# Patient Record
Sex: Female | Born: 1960
Health system: Southern US, Community
[De-identification: ages and names within clinical notes are randomized; demographics above are authoritative.]

## PROBLEM LIST (undated history)

## (undated) DIAGNOSIS — T8859XA Other complications of anesthesia, initial encounter: Secondary | ICD-10-CM

## (undated) DIAGNOSIS — E119 Type 2 diabetes mellitus without complications: Secondary | ICD-10-CM

## (undated) DIAGNOSIS — R011 Cardiac murmur, unspecified: Secondary | ICD-10-CM

## (undated) DIAGNOSIS — F32A Depression, unspecified: Secondary | ICD-10-CM

## (undated) DIAGNOSIS — T4145XA Adverse effect of unspecified anesthetic, initial encounter: Secondary | ICD-10-CM

## (undated) DIAGNOSIS — F329 Major depressive disorder, single episode, unspecified: Secondary | ICD-10-CM

## (undated) DIAGNOSIS — M316 Other giant cell arteritis: Secondary | ICD-10-CM

## (undated) DIAGNOSIS — I10 Essential (primary) hypertension: Secondary | ICD-10-CM

## (undated) DIAGNOSIS — K219 Gastro-esophageal reflux disease without esophagitis: Secondary | ICD-10-CM

## (undated) DIAGNOSIS — E785 Hyperlipidemia, unspecified: Secondary | ICD-10-CM

## (undated) DIAGNOSIS — E669 Obesity, unspecified: Secondary | ICD-10-CM

## (undated) DIAGNOSIS — I4891 Unspecified atrial fibrillation: Secondary | ICD-10-CM

## (undated) DIAGNOSIS — G4730 Sleep apnea, unspecified: Secondary | ICD-10-CM

## (undated) HISTORY — PX: REDUCTION MAMMAPLASTY: SUR839

## (undated) HISTORY — DX: Obesity, unspecified: E66.9

## (undated) HISTORY — DX: Hyperlipidemia, unspecified: E78.5

## (undated) HISTORY — PX: TUBAL LIGATION: SHX77

## (undated) HISTORY — DX: Major depressive disorder, single episode, unspecified: F32.9

## (undated) HISTORY — DX: Essential (primary) hypertension: I10

## (undated) HISTORY — DX: Type 2 diabetes mellitus without complications: E11.9

## (undated) HISTORY — DX: Depression, unspecified: F32.A

## (undated) HISTORY — DX: Gastro-esophageal reflux disease without esophagitis: K21.9

## (undated) HISTORY — DX: Cardiac murmur, unspecified: R01.1

## (undated) HISTORY — PX: BREAST SURGERY: SHX581

## (undated) HISTORY — PX: COLONOSCOPY: SHX174

---

## 1999-08-31 ENCOUNTER — Encounter: Payer: Self-pay | Admitting: Gynecology

## 1999-08-31 ENCOUNTER — Ambulatory Visit (HOSPITAL_COMMUNITY): Admission: RE | Admit: 1999-08-31 | Discharge: 1999-08-31 | Payer: Self-pay | Admitting: Gynecology

## 1999-11-02 ENCOUNTER — Ambulatory Visit (HOSPITAL_COMMUNITY): Admission: RE | Admit: 1999-11-02 | Discharge: 1999-11-02 | Payer: Self-pay | Admitting: Gynecology

## 1999-11-02 ENCOUNTER — Encounter: Payer: Self-pay | Admitting: Gynecology

## 2000-08-21 ENCOUNTER — Other Ambulatory Visit: Admission: RE | Admit: 2000-08-21 | Discharge: 2000-08-21 | Payer: Self-pay | Admitting: Gynecology

## 2001-01-15 ENCOUNTER — Encounter: Admission: RE | Admit: 2001-01-15 | Discharge: 2001-04-15 | Payer: Self-pay | Admitting: Family Medicine

## 2002-05-20 ENCOUNTER — Encounter: Payer: Self-pay | Admitting: Family Medicine

## 2002-05-20 ENCOUNTER — Ambulatory Visit (HOSPITAL_COMMUNITY): Admission: RE | Admit: 2002-05-20 | Discharge: 2002-05-20 | Payer: Self-pay | Admitting: Family Medicine

## 2002-06-26 ENCOUNTER — Encounter: Admission: RE | Admit: 2002-06-26 | Discharge: 2002-06-26 | Payer: Self-pay | Admitting: Family Medicine

## 2002-06-26 ENCOUNTER — Encounter: Payer: Self-pay | Admitting: Family Medicine

## 2003-04-16 ENCOUNTER — Ambulatory Visit (HOSPITAL_COMMUNITY): Admission: RE | Admit: 2003-04-16 | Discharge: 2003-04-16 | Payer: Self-pay | Admitting: Obstetrics and Gynecology

## 2004-11-21 ENCOUNTER — Encounter: Admission: RE | Admit: 2004-11-21 | Discharge: 2005-02-19 | Payer: Self-pay | Admitting: Family Medicine

## 2005-09-28 ENCOUNTER — Observation Stay (HOSPITAL_COMMUNITY): Admission: EM | Admit: 2005-09-28 | Discharge: 2005-09-29 | Payer: Self-pay | Admitting: Nurse Practitioner

## 2010-08-13 ENCOUNTER — Encounter: Payer: Self-pay | Admitting: Obstetrics and Gynecology

## 2010-10-20 ENCOUNTER — Other Ambulatory Visit: Payer: Self-pay | Admitting: Family Medicine

## 2010-10-20 ENCOUNTER — Other Ambulatory Visit (HOSPITAL_COMMUNITY)
Admission: RE | Admit: 2010-10-20 | Discharge: 2010-10-20 | Disposition: A | Discharge status: Home / Self Care | Payer: Self-pay | Source: Ambulatory Visit | Attending: Family Medicine | Admitting: Family Medicine

## 2010-10-20 DIAGNOSIS — Z124 Encounter for screening for malignant neoplasm of cervix: Secondary | ICD-10-CM | POA: Insufficient documentation

## 2014-02-10 ENCOUNTER — Encounter: Payer: Self-pay | Admitting: *Deleted

## 2014-02-10 ENCOUNTER — Encounter: Payer: 59 | Attending: Family Medicine | Admitting: *Deleted

## 2014-02-10 VITALS — Ht 62.0 in | Wt 284.7 lb

## 2014-02-10 DIAGNOSIS — Z713 Dietary counseling and surveillance: Secondary | ICD-10-CM | POA: Insufficient documentation

## 2014-02-10 DIAGNOSIS — E119 Type 2 diabetes mellitus without complications: Secondary | ICD-10-CM | POA: Insufficient documentation

## 2014-02-10 DIAGNOSIS — Z794 Long term (current) use of insulin: Secondary | ICD-10-CM | POA: Insufficient documentation

## 2014-02-10 NOTE — Progress Notes (Signed)
Appt start time: 1530 end time:  1700.  Assessment:  Patient was seen on  02/10/14 for individual diabetes education. She lives with her husband, she shops and does the cooking. History of DM 2 for about 14 years. Caring for mother an hour away. Works as Scientist, water quality at [employer], hours vary each week, high stress between caring for mother and poor understanding of her diabetes in the work place. Insurance not covering her Accu-Chek meter, doesn't like the One Touch so hasn't been using it lately. She enjoys crocheting and knitting. She belongs to a couple of knitting and crocheting groups on Monday and Wednesday nights. She states she has low BG usually at work and she passed out one time, called EMT.  Patient Education Plan per assessed needs and concerns is to attend individual session for Diabetes Self Management Education.  Current HbA1c: 8 % stated by patient  Preferred Learning Style:   No preference indicated   Learning Readiness:   Ready  Change in progress  MEDICATIONS: see list, diabetes medications are Amaryl and Lantus  DIETARY INTAKE:  24-hr recall:  B ( AM): skips if working, if off work she may have cerea with soy milk OR eggs, bacon, toast with light mayo or butter and sugar free jelly, diet soda  Snk ( AM): occasionally yogurt [place] OR pineapple OR crackers with PNB  L ( PM): skips often, home made pizza OR grilled chicken or burger on bun with lettuce, onions and cheese Snk ( PM): same as AM D ( PM): meat, potato, yogurt, occasionally vegetable,  Snk ( PM): 2-3 scoops ice cream Beverages: diet soda or water  Usual physical activity: not right now  Estimated energy needs: 1500 calories 170 g carbohydrates 112 g protein 42 g fat  Progress Towards Goal(s):  In progress.   Nutritional Diagnosis:  NB-1.1 Food and nutrition-related knowledge deficit As related to diabetes.  As evidenced by stated A1c of 8%.    Intervention:  Nutrition counseling  provided.  Discussed diabetes disease process and treatment options.  Discussed physiology of diabetes and role of obesity on insulin resistance.  Encouraged moderate weight reduction to improve glucose levels.  Patient medications.  Discussed role of medication on blood glucose and possible side effects  Blood glucose monitoring and interpretation.  Discussed recommended target ranges and individual ranges.    Short-term complications: hyper- and hypo-glycemia.  Discussed causes,symptoms, and treatment options.  Started to provide education on macronutrients on glucose levels.  Provided education on carb counting, importance of regularly scheduled meals/snacks, and meal planning. Plan to continue at next visit  Plan to discuss at next visit:  Effects of physical activity on glucose levels and long-term glucose control.  Recommended 150 minutes of physical activity/week.    Prevention, detection, and treatment of long-term complications.  Discussed the role of prolonged elevated glucose levels on body systems.  Role of stress on blood glucose levels and discussed strategies to manage psychosocial issues.  Recommendations for long-term diabetes self-care.  Provided checklist for medical, dental, and emotional self-care.  Plan:  Aim for 3 Carb Choices per meal (45 grams) +/- 1 either way  Aim for 0-2 Carbs per snack if hungry  Include protein in moderation with your meals and snacks Consider reading food labels for Total Carbohydrate of foods Consider asking your MD about adding a fast acting insulin to use to correct high BGs Consider checking BG at alternate times per day as directed by MD  Consider taking medication Lantus at  consistent time each day  Teaching Method Utilized: Visual, Auditory, and Hands on  Handouts given during visit include: Living Well with Diabetes Carb Counting and Food Label handouts Meal Plan Card  Barriers to learning/adherence to lifestyle change:  stress of caring for parent with Alzheimers  Diabetes self-care support plan:   [facility] support group  Demonstrated degree of understanding via:  Teach Back   Monitoring/Evaluation:  Dietary intake, exercise, reading food labels, and body weight in 1 month(s).

## 2014-02-10 NOTE — Patient Instructions (Signed)
Plan:  Aim for 3 Carb Choices per meal (45 grams) +/- 1 either way  Aim for 0-2 Carbs per snack if hungry  Include protein in moderation with your meals and snacks Consider reading food labels for Total Carbohydrate of foods Consider asking your MD about adding a fast acting insulin to use to correct high BGs Consider checking BG at alternate times per day as directed by MD  Consider taking medication Lantus at consistent time each day

## 2014-02-13 ENCOUNTER — Encounter: Payer: Self-pay | Admitting: *Deleted

## 2014-02-23 ENCOUNTER — Encounter: Payer: 59 | Attending: Family Medicine | Admitting: *Deleted

## 2014-02-23 ENCOUNTER — Encounter: Payer: Self-pay | Admitting: *Deleted

## 2014-02-23 VITALS — Ht 62.0 in | Wt 288.1 lb

## 2014-02-23 DIAGNOSIS — Z713 Dietary counseling and surveillance: Secondary | ICD-10-CM | POA: Diagnosis not present

## 2014-02-23 DIAGNOSIS — E119 Type 2 diabetes mellitus without complications: Secondary | ICD-10-CM | POA: Diagnosis present

## 2014-02-23 DIAGNOSIS — Z794 Long term (current) use of insulin: Secondary | ICD-10-CM | POA: Insufficient documentation

## 2014-02-23 NOTE — Progress Notes (Signed)
Appt start time: 1400 end time:  1500.  Assessment:  Patient was seen on  02/23/14 for individual diabetes education follow up visit. She is surprised at weight gain of 3 pounds in last 2 weeks, but feels she has been eating less food. She states she is taking her Lantus at more consistent time each day. She continues to struggle with her work schedule and not having set break times to eat or even drink anything. She is trying Systems developer at a breakfast choice at work.   Patient Education Plan per assessed needs and concerns is to attend individual session for Diabetes Self Management Education.  Current HbA1c: 8 % stated by patient  Preferred Learning Style:   No preference indicated   Learning Readiness:   Ready  Change in progress  MEDICATIONS: see list, diabetes medications are Amaryl and Lantus  DIETARY INTAKE:  24-hr recall:  B ( AM): skips if working, if off work she may have cerea with soy milk OR eggs, bacon, toast with light mayo or butter and sugar free jelly, diet soda  Snk ( AM): occasionally yogurt [place] OR pineapple OR crackers with PNB  L ( PM): skips often, home made pizza OR grilled chicken or burger on bun with lettuce, onions and cheese Snk ( PM): same as AM D ( PM): meat, potato, yogurt, occasionally vegetable,  Snk ( PM): 2-3 scoops ice cream Beverages: diet soda or water  Usual physical activity: not right now  Estimated energy needs: 1500 calories 170 g carbohydrates 112 g protein 42 g fat  Progress Towards Goal(s):  In progress.   Nutritional Diagnosis:  NB-1.1 Food and nutrition-related knowledge deficit As related to diabetes.  As evidenced by stated A1c of 8%.    Intervention:  Nutrition counseling continued..  Continued to provide education on macronutrients on glucose levels.  Provided education on carb counting, importance of regularly scheduled meals/snacks, and meal planning. Reviewed reading food labels and informed her of  APP for her phone; Calorie [employer] so she can look up Carb content of foods she enjoys.  Answered questions about insulin injection sites and demonstrated locations for her body.  Plan to discuss at next visit:  Effects of physical activity on glucose levels and long-term glucose control.  Recommended 150 minutes of physical activity/week.  Prevention, detection, and treatment of long-term complications.  Discussed the role of prolonged elevated glucose levels on body systems.  Role of stress on blood glucose levels and discussed strategies to manage psychosocial issues.  Recommendations for long-term diabetes self-care.  Provided checklist for medical, dental, and emotional self-care.  Plan:  Aim for 4 Carb Choices per meal (60 grams) +/- 1 either way  Aim for 0-2 Carbs per snack if hungry  Include protein in moderation with your meals and snacks Consider options for carb choices at work when you don't have time to eat a meal, to prevent low BG Consider reading food labels for Total Carbohydrate of foods Consider asking your MD about adding a fast acting insulin to use to correct high BGs Consider checking BG at alternate times per day as directed by MD  Continue taking medication Lantus at consistent time each day   Teaching Method Utilized: Visual, Auditory, and Hands on  Handouts given during visit include: Insulin injection Location Handout Carb Counting and Food Label handouts  Barriers to learning/adherence to lifestyle change: stress of caring for parent with Alzheimer's and stressful work place  Diabetes self-care support plan:   [facility]  support group  Demonstrated degree of understanding via:  Teach Back   Monitoring/Evaluation:  Dietary intake, exercise, reading food labels, and body weight in 1 month(s).

## 2014-02-23 NOTE — Patient Instructions (Signed)
Plan:  Aim for 4 Carb Choices per meal (60 grams) +/- 1 either way  Aim for 0-2 Carbs per snack if hungry  Include protein in moderation with your meals and snacks Consider options for carb choices at work when you don't have time to eat a meal, to prevent low BG Consider reading food labels for Total Carbohydrate of foods Consider asking your MD about adding a fast acting insulin to use to correct high BGs Consider checking BG at alternate times per day as directed by MD  Continue taking medication Lantus at consistent time each day

## 2014-03-19 ENCOUNTER — Ambulatory Visit: Payer: 59 | Admitting: *Deleted

## 2014-04-02 ENCOUNTER — Ambulatory Visit: Payer: 59 | Admitting: *Deleted

## 2014-05-19 ENCOUNTER — Encounter: Payer: 59 | Attending: Family Medicine | Admitting: *Deleted

## 2014-05-19 VITALS — Ht 62.0 in | Wt 283.6 lb

## 2014-05-19 DIAGNOSIS — E119 Type 2 diabetes mellitus without complications: Secondary | ICD-10-CM | POA: Insufficient documentation

## 2014-05-19 DIAGNOSIS — Z794 Long term (current) use of insulin: Secondary | ICD-10-CM | POA: Diagnosis not present

## 2014-05-19 DIAGNOSIS — Z713 Dietary counseling and surveillance: Secondary | ICD-10-CM | POA: Diagnosis not present

## 2014-05-19 NOTE — Patient Instructions (Signed)
Plan:  Aim for 4 Carb Choices per meal (60 grams) +/- 1 either way  Aim for 0-2 Carbs per snack if hungry  Include protein in moderation with your meals and snacks Consider options for carb choices at work when you don't have time to eat a meal, to prevent low BG Consider reading food labels for Total Carbohydrate of foods Consider asking your MD about adding a fast acting insulin to use to correct high BGs Consider checking BG at alternate times per day as directed by MD  Continue taking medication Lantus at consistent time each day

## 2014-05-19 NOTE — Progress Notes (Signed)
Appt start time: 1400 end time:  1430.  Assessment:  Patient was seen on  05/19/14 for individual diabetes education follow up visit. She is happy with weight loss of 5 pounds since last visit, 2 months ago. She is worried about her mother who is no longer eating and has Alzhiemer's. She states she is taking her Lantus at more consistent time each day. She continues to struggle with her work schedule at [employer] and not having set break times to eat or even drink anything. She is working on [employer] as able with the many stresses in her life right now.  Patient Education Plan per assessed needs and concerns is to attend individual session for Diabetes Self Management Education.  Current HbA1c: 8 % stated by patient  Preferred Learning Style:   No preference indicated   Learning Readiness:   Ready  Change in progress  MEDICATIONS: see list, diabetes medications are Amaryl, Lantus and Januvia  DIETARY INTAKE:  24-hr recall:  B ( AM): skips if working, if off work she may have cerea with soy milk OR eggs, bacon, toast with light mayo or butter and sugar free jelly, diet soda  Snk ( AM): occasionally yogurt [place] OR pineapple OR crackers with PNB  L ( PM): skips often, home made pizza OR grilled chicken or burger on bun with lettuce, onions and cheese Snk ( PM): same as AM D ( PM): meat, potato, yogurt, occasionally vegetable,  Snk ( PM): 2-3 scoops ice cream Beverages: diet soda or water  Usual physical activity: not right now  Estimated energy needs: 1500 calories 170 g carbohydrates 112 g protein 42 g fat  Progress Towards Goal(s):  In progress.   Nutritional Diagnosis:  NB-1.1 Food and nutrition-related knowledge deficit As related to diabetes.  As evidenced by stated A1c of 8%.    Intervention:  Nutrition counseling continued.Marland Kitchen  Spent much of this visit listening to her concerns about losing her mother in the near future and her concerns about her job situation.  Discussed the support she and her family would get if they called Hospice to help with her mother's end of life. From a Diabetes standpoint, I reviewed the advantages of carb counting as method of portion control and the rationale of SMBG at least once a day.  Plan:  Aim for 4 Carb Choices per meal (60 grams) +/- 1 either way  Aim for 0-2 Carbs per snack if hungry  Include protein in moderation with your meals and snacks Consider options for carb choices at work when you don't have time to eat a meal, to prevent low BG Consider reading food labels for Total Carbohydrate of foods Consider asking your MD about adding a fast acting insulin to use to correct high BGs Consider checking BG at alternate times per day as directed by MD  Continue taking medication Lantus at consistent time each day   Teaching Method Utilized: Visual, Auditory, and Hands on  Handouts given during visit include: No new handouts  Barriers to learning/adherence to lifestyle change: stress of caring for parent with Alzheimer's and stressful work place  Diabetes self-care support plan:   [facility] support group  Demonstrated degree of understanding via:  Teach Back   Monitoring/Evaluation:  Dietary intake, exercise, reading food labels, and body weight in 3 month(s).

## 2014-08-25 ENCOUNTER — Ambulatory Visit: Payer: 59 | Admitting: *Deleted

## 2014-08-31 ENCOUNTER — Other Ambulatory Visit: Payer: Self-pay | Admitting: Family Medicine

## 2014-08-31 DIAGNOSIS — Z1231 Encounter for screening mammogram for malignant neoplasm of breast: Secondary | ICD-10-CM

## 2014-09-08 ENCOUNTER — Ambulatory Visit
Admission: RE | Admit: 2014-09-08 | Discharge: 2014-09-08 | Disposition: A | Discharge status: Home / Self Care | Payer: 59 | Source: Ambulatory Visit | Attending: Family Medicine | Admitting: Family Medicine

## 2014-09-08 DIAGNOSIS — Z1231 Encounter for screening mammogram for malignant neoplasm of breast: Secondary | ICD-10-CM

## 2015-12-22 ENCOUNTER — Ambulatory Visit: Payer: Self-pay | Admitting: General Surgery

## 2016-02-01 NOTE — Patient Instructions (Addendum)
Angela [name]  02/01/2016   Your procedure is scheduled on: Thursday 02/10/2016  Report to [facility] Main  Entrance take [place]  elevators to 3rd floor to  [place] at  [place]  AM.  Call this number if you have problems the morning of surgery [phone]   Remember: ONLY 1 PERSON MAY GO WITH YOU TO SHORT STAY TO GET  READY MORNING OF [place].   Do not eat food or drink liquids :After Midnight.   TAKE 1/2 DOSE OF BASAGLAR INSULIN THE NIGHT BEFORE SURGERY!   Take these medicines the morning of surgery with A SIP OF WATER:  Prednisone             DO NOT TAKE ANY DIABETIC MEDICATIONS DAY OF YOUR SURGERY!                               You may not have any metal on your body including hair pins and              piercings  Do not wear jewelry, make-up, lotions, powders or perfumes, deodorant             Do not wear nail polish.  Do not shave  48 hours prior to surgery.              Men may shave face and neck.   Do not bring valuables to the hospital. [place].  Contacts, dentures or bridgework may not be worn into surgery.  Leave suitcase in the car. After surgery it may be brought to your room.     Patients discharged the day of surgery will not be allowed to drive home.  Name and phone number of your driver:  Special Instructions: N/A              Please read over the following fact sheets you were given: _____________________________________________________________________             [facility] - Preparing for Surgery Before surgery, you can play an important role.  Because skin is not sterile, your skin needs to be as free of germs as possible.  You can reduce the number of germs on your skin by washing with CHG (chlorahexidine gluconate) soap before surgery.  CHG is an antiseptic cleaner which kills germs and bonds with the skin to continue killing germs even after washing. Please DO NOT  use if you have an allergy to CHG or antibacterial soaps.  If your skin becomes reddened/irritated stop using the CHG and inform your nurse when you arrive at Short Stay. Do not shave (including legs and underarms) for at least 48 hours prior to the first CHG shower.  You may shave your face/neck. Please follow these instructions carefully:  1.  Shower with CHG Soap the night before surgery and the  morning of Surgery.  2.  If you choose to wash your hair, wash your hair first as usual with your  normal  shampoo.  3.  After you shampoo, rinse your hair and body thoroughly to remove the  shampoo.  4.  Use CHG as you would any other liquid soap.  You can apply chg directly  to the skin and wash                       Gently with a scrungie or clean washcloth.  5.  Apply the CHG Soap to your body ONLY FROM THE NECK DOWN.   Do not use on face/ open                           Wound or open sores. Avoid contact with eyes, ears mouth and genitals (private parts).                       Wash face,  Genitals (private parts) with your normal soap.             6.  Wash thoroughly, paying special attention to the area where your surgery  will be performed.  7.  Thoroughly rinse your body with warm water from the neck down.  8.  DO NOT shower/wash with your normal soap after using and rinsing off  the CHG Soap.                9.  Pat yourself dry with a clean towel.            10.  Wear clean pajamas.            11.  Place clean sheets on your bed the night of your first shower and do not  sleep with pets. Day of Surgery : Do not apply any lotions/deodorants the morning of surgery.  Please wear clean clothes to the hospital/surgery center.  FAILURE TO FOLLOW THESE INSTRUCTIONS MAY RESULT IN THE CANCELLATION OF YOUR SURGERY PATIENT SIGNATURE_________________________________  NURSE  SIGNATURE__________________________________  ________________________________________________________________________

## 2016-02-02 ENCOUNTER — Encounter (HOSPITAL_COMMUNITY): Payer: Self-pay

## 2016-02-02 ENCOUNTER — Other Ambulatory Visit: Payer: Self-pay

## 2016-02-02 ENCOUNTER — Encounter (HOSPITAL_COMMUNITY)
Admission: RE | Admit: 2016-02-02 | Discharge: 2016-02-02 | Disposition: A | Discharge status: Home / Self Care | Payer: 59 | Source: Ambulatory Visit | Attending: General Surgery | Admitting: General Surgery

## 2016-02-02 DIAGNOSIS — Z01812 Encounter for preprocedural laboratory examination: Secondary | ICD-10-CM | POA: Diagnosis not present

## 2016-02-02 HISTORY — DX: Other complications of anesthesia, initial encounter: T88.59XA

## 2016-02-02 HISTORY — DX: Adverse effect of unspecified anesthetic, initial encounter: T41.45XA

## 2016-02-02 LAB — CBC
HCT: 37.3 % (ref 36.0–46.0)
Hemoglobin: 11.7 g/dL — ABNORMAL LOW (ref 12.0–15.0)
MCH: 27 pg (ref 26.0–34.0)
MCHC: 31.4 g/dL (ref 30.0–36.0)
MCV: 86.1 fL (ref 78.0–100.0)
PLATELETS: 369 10*3/uL (ref 150–400)
RBC: 4.33 MIL/uL (ref 3.87–5.11)
RDW: 13.9 % (ref 11.5–15.5)
WBC: 21.9 10*3/uL — AB (ref 4.0–10.5)

## 2016-02-02 LAB — BASIC METABOLIC PANEL
Anion gap: 8 (ref 5–15)
BUN: 22 mg/dL — AB (ref 6–20)
CO2: 29 mmol/L (ref 22–32)
CREATININE: 0.89 mg/dL (ref 0.44–1.00)
Calcium: 9.6 mg/dL (ref 8.9–10.3)
Chloride: 103 mmol/L (ref 101–111)
GFR calc Af Amer: >60 mL/min (ref >60)
GLUCOSE: 56 mg/dL — AB (ref 65–99)
Potassium: 4 mmol/L (ref 3.5–5.1)
SODIUM: 140 mmol/L (ref 135–145)

## 2016-02-02 NOTE — Progress Notes (Signed)
   02/02/16 1028  OBSTRUCTIVE SLEEP APNEA  Have you ever been diagnosed with sleep apnea through a sleep study? No  Do you snore loudly (loud enough to be heard through closed doors)?  1  Do you often feel tired, fatigued, or sleepy during the daytime (such as falling asleep during driving or talking to someone)? 1  Has anyone observed you stop breathing during your sleep? 0  Do you have, or are you being treated for high blood pressure? 1  BMI more than 35 kg/m2? 1  Age > 50 (1-yes) 1  Neck circumference greater than:Female 16 inches or larger, Female 17inches or larger? 1  Female Gender (Yes=1) 0  Obstructive Sleep Apnea Score 6  Score 5 or greater  Results sent to PCP

## 2016-02-03 LAB — HEMOGLOBIN A1C
HEMOGLOBIN A1C: 12.5 % — AB (ref 4.8–5.6)
MEAN PLASMA GLUCOSE: 312 mg/dL

## 2016-02-03 NOTE — Progress Notes (Signed)
Consulted Dr. Montez Hageman, Anesthesia about confirmed EKG from 02/02/2016 and per Dr. Marcell Barlow, patient needs Cardiac Clearance before  having surgery! Angela [name] at [facility] notified to inform Dr. Kieth Brightly.

## 2016-02-07 ENCOUNTER — Encounter: Payer: Self-pay | Admitting: Internal Medicine

## 2016-02-07 ENCOUNTER — Ambulatory Visit (INDEPENDENT_AMBULATORY_CARE_PROVIDER_SITE_OTHER): Payer: 59 | Admitting: Internal Medicine

## 2016-02-07 ENCOUNTER — Telehealth: Payer: Self-pay | Admitting: *Deleted

## 2016-02-07 ENCOUNTER — Encounter (INDEPENDENT_AMBULATORY_CARE_PROVIDER_SITE_OTHER): Payer: Self-pay

## 2016-02-07 VITALS — BP 144/82 | HR 101 | Ht 62.5 in | Wt 282.4 lb

## 2016-02-07 DIAGNOSIS — R9431 Abnormal electrocardiogram [ECG] [EKG]: Secondary | ICD-10-CM

## 2016-02-07 DIAGNOSIS — Z01818 Encounter for other preprocedural examination: Secondary | ICD-10-CM | POA: Diagnosis not present

## 2016-02-07 NOTE — Telephone Encounter (Signed)
Spoke with family member (ok per DPR) and informed him to tell patient. Son will relay the message.

## 2016-02-07 NOTE — Patient Instructions (Addendum)
Medication Instructions:  Your physician recommends that you continue on your current medications as directed. Please refer to the Current Medication list given to you today.  --- If you need a refill on your cardiac medications before your next appointment, please call your pharmacy. ---  Labwork: None ordered  Testing/Procedures: Your physician has requested that you have a lexiscan myoview. For further information please visit [email or web address]. Please follow instruction sheet, as given.  Follow-Up: To be determined based upon your stress test results.  We will call you with the results.  Thank you for choosing [facility]!!     Any Other Special Instructions Will Be Listed Below (If Applicable).  Pharmacologic Stress Electrocardiogram A pharmacologic stress electrocardiogram is a heart (cardiac) test that uses nuclear imaging to evaluate the blood supply to your heart. This test may also be called a pharmacologic stress electrocardiography. Pharmacologic means that a medicine is used to increase your heart rate and blood pressure.  This stress test is done to find areas of poor blood flow to the heart by determining the extent of coronary artery disease (CAD). Some people exercise on a treadmill, which naturally increases the blood flow to the heart. For those people unable to exercise on a treadmill, a medicine is used. This medicine stimulates your heart and will cause your heart to beat harder and more quickly, as if you were exercising.  Pharmacologic stress tests can help determine:  The adequacy of blood flow to your heart during increased levels of activity in order to clear you for discharge home.  The extent of coronary artery blockage caused by CAD.  Your prognosis if you have suffered a heart attack.  The effectiveness of cardiac procedures done, such as an angioplasty, which can increase the circulation in your coronary arteries.  Causes of chest pain or  pressure. LET [facility] CARE PROVIDER KNOW ABOUT:  Any allergies you have.  All medicines you are taking, including vitamins, herbs, eye drops, creams, and over-the-counter medicines.  Previous problems you or members of your family have had with the use of anesthetics.  Any blood disorders you have.  Previous surgeries you have had.  Medical conditions you have.  Possibility of pregnancy, if this applies.  If you are currently breastfeeding. RISKS AND COMPLICATIONS Generally, this is a safe procedure. However, as with any procedure, complications can occur. Possible complications include:  You develop pain or pressure in the following areas:  Chest.  Jaw or neck.  Between your shoulder blades.  Radiating down your left arm.  Headache.  Dizziness or light-headedness.  Shortness of breath.  Increased or irregular heartbeat.  Low blood pressure.  Nausea or vomiting.  Flushing.  Redness going up the arm and slight pain during injection of medicine.  Heart attack (rare). BEFORE THE PROCEDURE   Avoid all forms of caffeine for 24 hours before your test or as directed by your health care provider. This includes coffee, tea (even decaffeinated tea), caffeinated sodas, chocolate, cocoa, and certain pain medicines.  Follow your health care provider's instructions regarding eating and drinking before the test.  Take your medicines as directed at regular times with water unless instructed otherwise. Exceptions may include:  If you have diabetes, ask how you are to take your insulin or pills. It is common to adjust insulin dosing the morning of the test.  If you are taking beta-blocker medicines, it is important to talk to your health care provider about these medicines well before the date of your  test. Taking beta-blocker medicines may interfere with the test. In some cases, these medicines need to be changed or stopped 24 hours or more before the test.  If you wear  a nitroglycerin patch, it may need to be removed prior to the test. Ask your health care provider if the patch should be removed before the test.  If you use an inhaler for any breathing condition, bring it with you to the test.  If you are an outpatient, bring a snack so you can eat right after the stress phase of the test.  Do not smoke for 4 hours prior to the test or as directed by your health care provider.  Do not apply lotions, powders, creams, or oils on your chest prior to the test.  Wear comfortable shoes and clothing. Let your health care provider know if you were unable to complete or follow the preparations for your test. PROCEDURE   Multiple patches (electrodes) will be put on your chest. If needed, small areas of your chest may be shaved to get better contact with the electrodes. Once the electrodes are attached to your body, multiple wires will be attached to the electrodes, and your heart rate will be monitored.  An IV access will be started. A nuclear trace (isotope) is given. The isotope may be given intravenously, or it may be swallowed. Nuclear refers to several types of radioactive isotopes, and the nuclear isotope lights up the arteries so that the nuclear images are clear. The isotope is absorbed by your body. This results in low radiation exposure.  A resting nuclear image is taken to show how your heart functions at rest.  A medicine is given through the IV access.  A second scan is done about 1 hour after the medicine injection and determines how your heart functions under stress.  During this stress phase, you will be connected to an electrocardiogram machine. Your blood pressure and oxygen levels will be monitored. AFTER THE PROCEDURE   Your heart rate and blood pressure will be monitored after the test.  You may return to your normal schedule, including diet,activities, and medicines, unless your health care provider tells you otherwise.   This  information is not intended to replace advice given to you by your health care provider. Make sure you discuss any questions you have with your health care provider.   Document Released: 11/26/2008 Document Revised: 07/15/2013 Document Reviewed: 03/17/2013 Elsevier Interactive Patient Education [employer].

## 2016-02-07 NOTE — Telephone Encounter (Signed)
Follow Up:   Angela [name] from Dr Kieth Brightly called and said Dr Kieth Brightly will not be the one making the decision whether the surgery will be cancelled.The decision to cancel surgery will come from [facility] Pre Op/Anthesis.

## 2016-02-07 NOTE — Progress Notes (Signed)
HPI [name] is referred today for preoperative evaluation. She is a 55 yo woman with a h/o HTN, DM, and eye pain. She was found on a routine ECG to have abnormal T wave inversions and is referred for additional evaluation. The patient does not have angina. She denies sob but is sedentary. She has been diabetic for 16 years. She is morbidly obese. No syncope.  Allergies  Allergen Reactions  . E-Mycin [Erythromycin] Nausea And Vomiting  . Avocado Diarrhea and Nausea And Vomiting  . Banana Diarrhea and Nausea And Vomiting  . Glucophage [Metformin Hcl] Diarrhea and Nausea And Vomiting  . Kiwi Extract Diarrhea and Nausea And Vomiting  . Mango Flavor Diarrhea  . Levemir [Insulin Detemir] Rash  . Lexapro [Escitalopram Oxalate] Itching and Other (See Comments)    Numb lips     Current Outpatient Prescriptions  Medication Sig Dispense Refill  . aspirin 81 MG chewable tablet Chew 81 mg by mouth daily.    . Cholecalciferol (VITAMIN D PO) Take 1 tablet by mouth daily.    Marland Kitchen CINNAMON PO Take 1 tablet by mouth daily.    . DULoxetine (CYMBALTA) 60 MG capsule Take 60 mg by mouth daily.    Marland Kitchen esomeprazole (NEXIUM) 40 MG capsule Take 40 mg by mouth daily.    . folic acid (FOLVITE) 1 MG tablet Take 1 mg by mouth daily.    Marland Kitchen glimepiride (AMARYL) 4 MG tablet Take 4 mg by mouth daily.     . Insulin Glargine (BASAGLAR KWIKPEN) 100 UNIT/ML SOPN Inject 30-70 Units into the skin at bedtime. Takes 30 units every night but if she takes prednisone she has to take 70 units    . JANUVIA 100 MG tablet Take 100 mg by mouth daily.  0  . lisinopril (PRINIVIL,ZESTRIL) 40 MG tablet Take 40 mg by mouth daily.    . pravastatin (PRAVACHOL) 40 MG tablet Take 40 mg by mouth daily.    . predniSONE (DELTASONE) 20 MG tablet Take 20-40 mg by mouth 2 (two) times daily with a meal. Takes 40mg  in the morning and 20mg  at night    . triamterene-hydrochlorothiazide (MAXZIDE-25) 37.5-25 MG tablet Take 1 tablet by mouth daily.   3   No current facility-administered medications for this visit.     Past Medical History  Diagnosis Date  . Diabetes mellitus without complication ([place])   . Hypertension   . Hyperlipidemia   . Depression   . Obesity   . GERD (gastroesophageal reflux disease)   . Heart murmur   . Complication of anesthesia     had epidural for C-section and epidural did not take until after surgery    ROS:   All systems reviewed and negative except as noted in the HPI.   Past Surgical History  Procedure Laterality Date  . Tubal ligation    . Breast surgery      bilateral breast reduction  . Colonoscopy       No family history on file.   Social History   Social History  . Marital Status: Married    Spouse Name: N/A  . Number of Children: N/A  . Years of Education: N/A   Occupational History  . Not on file.   Social History Main Topics  . Smoking status: Never Smoker   . Smokeless tobacco: Never Used  . Alcohol Use: Yes     Comment: occassionally  . Drug Use: No  . Sexual Activity: Not on file  Other Topics Concern  . Not on file   Social History Narrative     BP 144/82 mmHg  Pulse 101  Ht 5' 2.5" (1.588 m)  Wt 282 lb 6.4 oz (128.096 kg)  BMI 50.80 kg/m2  Physical Exam:  Well appearing NAD HEENT: Unremarkable Neck:  No JVD, no thyromegally Lymphatics:  No adenopathy Back:  No CVA tenderness Lungs:  Clear with no wheezes HEART:  Regular rate rhythm, no murmurs, no rubs, no clicks Abd:  soft, positive bowel sounds, no organomegally, no rebound, no guarding Ext:  2 plus pulses, no edema, no cyanosis, no clubbing Skin:  No rashes no nodules Neuro:  CN II through XII intact, motor grossly intact  EKG - NSR with inferolateral T wave inversion    Assess/Plan: 1. Abnormal ECG - her multiple cardiac risk factors and pending procedure and abnormal ECG are concerning. I have recommended she undergo Lexiscan myoview. If not high risk, she can proceed with  temporal artery biopsy. 2. Obesity - I have discussed the importance of weight loss. She admits to dietary indiscretion. 3. Hypertensive heart disease - the patient's blood pressure is a bit elevated. She has been counseled on risk factor modification. 4. Preop eval - she may proceed with her procedure if the lexiscan is low risk.  Mikle Bosworth.D.

## 2016-02-08 ENCOUNTER — Encounter (HOSPITAL_COMMUNITY): Payer: Self-pay | Admitting: Physician Assistant

## 2016-02-08 ENCOUNTER — Encounter (HOSPITAL_COMMUNITY)
Admission: RE | Admit: 2016-02-08 | Discharge: 2016-02-08 | Disposition: A | Discharge status: Home / Self Care | Payer: 59 | Source: Ambulatory Visit | Attending: Internal Medicine | Admitting: Internal Medicine

## 2016-02-08 ENCOUNTER — Emergency Department (HOSPITAL_COMMUNITY): Payer: 59

## 2016-02-08 ENCOUNTER — Observation Stay (HOSPITAL_COMMUNITY)
Admission: EM | Admit: 2016-02-08 | Discharge: 2016-02-10 | Disposition: A | Discharge status: Home / Self Care | Payer: 59 | Attending: Cardiology | Admitting: Cardiology

## 2016-02-08 DIAGNOSIS — I4891 Unspecified atrial fibrillation: Principal | ICD-10-CM | POA: Insufficient documentation

## 2016-02-08 DIAGNOSIS — I119 Hypertensive heart disease without heart failure: Secondary | ICD-10-CM | POA: Insufficient documentation

## 2016-02-08 DIAGNOSIS — I10 Essential (primary) hypertension: Secondary | ICD-10-CM | POA: Diagnosis not present

## 2016-02-08 DIAGNOSIS — Z7982 Long term (current) use of aspirin: Secondary | ICD-10-CM | POA: Insufficient documentation

## 2016-02-08 DIAGNOSIS — M316 Other giant cell arteritis: Secondary | ICD-10-CM | POA: Diagnosis not present

## 2016-02-08 DIAGNOSIS — R778 Other specified abnormalities of plasma proteins: Secondary | ICD-10-CM | POA: Insufficient documentation

## 2016-02-08 DIAGNOSIS — I480 Paroxysmal atrial fibrillation: Secondary | ICD-10-CM

## 2016-02-08 DIAGNOSIS — R7989 Other specified abnormal findings of blood chemistry: Secondary | ICD-10-CM

## 2016-02-08 DIAGNOSIS — E1165 Type 2 diabetes mellitus with hyperglycemia: Secondary | ICD-10-CM | POA: Diagnosis not present

## 2016-02-08 DIAGNOSIS — Z794 Long term (current) use of insulin: Secondary | ICD-10-CM

## 2016-02-08 DIAGNOSIS — R9431 Abnormal electrocardiogram [ECG] [EKG]: Secondary | ICD-10-CM

## 2016-02-08 DIAGNOSIS — Z01818 Encounter for other preprocedural examination: Secondary | ICD-10-CM

## 2016-02-08 DIAGNOSIS — E7800 Pure hypercholesterolemia, unspecified: Secondary | ICD-10-CM

## 2016-02-08 DIAGNOSIS — E785 Hyperlipidemia, unspecified: Secondary | ICD-10-CM | POA: Insufficient documentation

## 2016-02-08 DIAGNOSIS — IMO0002 Reserved for concepts with insufficient information to code with codable children: Secondary | ICD-10-CM

## 2016-02-08 HISTORY — DX: Other giant cell arteritis: M31.6

## 2016-02-08 LAB — CBC
HEMATOCRIT: 45.2 % (ref 36.0–46.0)
HEMOGLOBIN: 14.5 g/dL (ref 12.0–15.0)
MCH: 27.7 pg (ref 26.0–34.0)
MCHC: 32.1 g/dL (ref 30.0–36.0)
MCV: 86.3 fL (ref 78.0–100.0)
PLATELETS: 277 10*3/uL (ref 150–400)
RBC: 5.24 MIL/uL — AB (ref 3.87–5.11)
RDW: 14.6 % (ref 11.5–15.5)
WBC: 12.8 10*3/uL — AB (ref 4.0–10.5)

## 2016-02-08 LAB — PROTIME-INR
INR: 1.16 (ref 0.00–1.49)
Prothrombin Time: 15 seconds (ref 11.6–15.2)

## 2016-02-08 LAB — MAGNESIUM: Magnesium: 1.8 mg/dL (ref 1.7–2.4)

## 2016-02-08 LAB — HEPATIC FUNCTION PANEL
ALBUMIN: 3.3 g/dL — AB (ref 3.5–5.0)
ALK PHOS: 68 U/L (ref 38–126)
ALT: 113 U/L — AB (ref 14–54)
AST: 98 U/L — ABNORMAL HIGH (ref 15–41)
Bilirubin, Direct: 0.2 mg/dL (ref 0.1–0.5)
Indirect Bilirubin: 0.5 mg/dL (ref 0.3–0.9)
TOTAL PROTEIN: 6.8 g/dL (ref 6.5–8.1)
Total Bilirubin: 0.7 mg/dL (ref 0.3–1.2)

## 2016-02-08 LAB — T4, FREE: Free T4: 1.19 ng/dL — ABNORMAL HIGH (ref 0.61–1.12)

## 2016-02-08 LAB — GLUCOSE, CAPILLARY
GLUCOSE-CAPILLARY: 299 mg/dL — AB (ref 65–99)
Glucose-Capillary: 431 mg/dL — ABNORMAL HIGH (ref 65–99)
Glucose-Capillary: 385 mg/dL — ABNORMAL HIGH (ref 65–99)

## 2016-02-08 LAB — TSH: TSH: 0.753 u[IU]/mL (ref 0.350–4.500)

## 2016-02-08 LAB — BASIC METABOLIC PANEL
Anion gap: 8 (ref 5–15)
BUN: 23 mg/dL — ABNORMAL HIGH (ref 6–20)
CHLORIDE: 106 mmol/L (ref 101–111)
CO2: 24 mmol/L (ref 22–32)
CREATININE: 1.01 mg/dL — AB (ref 0.44–1.00)
Calcium: 9.8 mg/dL (ref 8.9–10.3)
GFR calc non Af Amer: >60 mL/min (ref >60)
Glucose, Bld: 263 mg/dL — ABNORMAL HIGH (ref 65–99)
POTASSIUM: 4.5 mmol/L (ref 3.5–5.1)
SODIUM: 138 mmol/L (ref 135–145)

## 2016-02-08 LAB — MRSA PCR SCREENING: MRSA BY PCR: NEGATIVE

## 2016-02-08 LAB — TROPONIN I
Troponin I: 0.83 ng/mL (ref <0.03)
Troponin I: 0.84 ng/mL (ref <0.03)

## 2016-02-08 LAB — HEPARIN LEVEL (UNFRACTIONATED): HEPARIN UNFRACTIONATED: 0.95 [IU]/mL — AB (ref 0.30–0.70)

## 2016-02-08 MED ORDER — DILTIAZEM HCL 100 MG IV SOLR
INTRAVENOUS | Status: AC
  Filled 2016-02-08: qty 100

## 2016-02-08 MED ORDER — PREDNISONE 20 MG PO TABS
20.0000 mg | ORAL_TABLET | Freq: Every day | ORAL | Status: DC
  Administered 2016-02-08 – 2016-02-09 (×2): 20 mg via ORAL
  Filled 2016-02-08 (×2): qty 1

## 2016-02-08 MED ORDER — PRAVASTATIN SODIUM 40 MG PO TABS
40.0000 mg | ORAL_TABLET | Freq: Every day | ORAL | Status: DC
  Administered 2016-02-09 – 2016-02-10 (×2): 40 mg via ORAL
  Filled 2016-02-08 (×2): qty 1

## 2016-02-08 MED ORDER — SODIUM CHLORIDE 0.9% FLUSH
3.0000 mL | Freq: Two times a day (BID) | INTRAVENOUS | Status: DC
  Administered 2016-02-08 – 2016-02-10 (×2): 3 mL via INTRAVENOUS

## 2016-02-08 MED ORDER — ASPIRIN 81 MG PO CHEW
81.0000 mg | CHEWABLE_TABLET | Freq: Every day | ORAL | Status: DC
  Administered 2016-02-08 – 2016-02-09 (×2): 81 mg via ORAL
  Filled 2016-02-08 (×2): qty 1

## 2016-02-08 MED ORDER — GLIMEPIRIDE 4 MG PO TABS
4.0000 mg | ORAL_TABLET | Freq: Every day | ORAL | Status: DC
  Administered 2016-02-09 – 2016-02-10 (×2): 4 mg via ORAL
  Filled 2016-02-08 (×2): qty 1

## 2016-02-08 MED ORDER — SODIUM CHLORIDE 0.9% FLUSH: 3.0000 mL | INTRAVENOUS | Status: DC | PRN

## 2016-02-08 MED ORDER — PREDNISONE 20 MG PO TABS
40.0000 mg | ORAL_TABLET | Freq: Every day | ORAL | Status: DC
  Administered 2016-02-09 – 2016-02-10 (×2): 40 mg via ORAL
  Filled 2016-02-08 (×2): qty 2

## 2016-02-08 MED ORDER — DILTIAZEM HCL 100 MG IV SOLR
5.0000 mg/h | INTRAVENOUS | Status: DC
  Administered 2016-02-08: 10 mg/h via INTRAVENOUS
  Administered 2016-02-08: 15 mg/h via INTRAVENOUS
  Filled 2016-02-08: qty 100

## 2016-02-08 MED ORDER — PANTOPRAZOLE SODIUM 40 MG PO TBEC: 80.0000 mg | DELAYED_RELEASE_TABLET | Freq: Every day | ORAL | Status: DC

## 2016-02-08 MED ORDER — HEPARIN BOLUS VIA INFUSION
5000.0000 [IU] | Freq: Once | INTRAVENOUS | Status: AC
  Administered 2016-02-08: 5000 [IU] via INTRAVENOUS
  Filled 2016-02-08: qty 5000

## 2016-02-08 MED ORDER — HEPARIN (PORCINE) IN NACL 100-0.45 UNIT/ML-% IJ SOLN: 900.0000 [IU]/h | INTRAMUSCULAR | Status: DC

## 2016-02-08 MED ORDER — DULOXETINE HCL 60 MG PO CPEP
60.0000 mg | ORAL_CAPSULE | Freq: Every day | ORAL | Status: DC
  Administered 2016-02-08 – 2016-02-10 (×3): 60 mg via ORAL
  Filled 2016-02-08 (×3): qty 1

## 2016-02-08 MED ORDER — INSULIN GLARGINE 100 UNIT/ML ~~LOC~~ SOLN
35.0000 [IU] | Freq: Every day | SUBCUTANEOUS | Status: DC
  Administered 2016-02-09: 35 [IU] via SUBCUTANEOUS
  Filled 2016-02-08 (×2): qty 0.35

## 2016-02-08 MED ORDER — METOPROLOL TARTRATE 25 MG PO TABS
25.0000 mg | ORAL_TABLET | Freq: Two times a day (BID) | ORAL | Status: DC
  Administered 2016-02-08 – 2016-02-10 (×4): 25 mg via ORAL
  Filled 2016-02-08 (×6): qty 1

## 2016-02-08 MED ORDER — ADENOSINE (DIAGNOSTIC) 3 MG/ML IV SOLN
INTRAVENOUS | Status: AC
  Filled 2016-02-08: qty 20

## 2016-02-08 MED ORDER — OFF THE BEAT BOOK
Freq: Once | Status: DC
  Filled 2016-02-08: qty 1

## 2016-02-08 MED ORDER — DILTIAZEM LOAD VIA INFUSION
10.0000 mg | Freq: Once | INTRAVENOUS | Status: AC
  Administered 2016-02-08: 10 mg via INTRAVENOUS
  Filled 2016-02-08: qty 10

## 2016-02-08 MED ORDER — FOLIC ACID 1 MG PO TABS
1.0000 mg | ORAL_TABLET | Freq: Every day | ORAL | Status: DC
  Administered 2016-02-08 – 2016-02-10 (×3): 1 mg via ORAL
  Filled 2016-02-08 (×3): qty 1

## 2016-02-08 MED ORDER — LISINOPRIL 40 MG PO TABS: 40.0000 mg | ORAL_TABLET | Freq: Every day | ORAL | Status: DC

## 2016-02-08 MED ORDER — DILTIAZEM HCL 60 MG PO TABS
60.0000 mg | ORAL_TABLET | Freq: Four times a day (QID) | ORAL | Status: DC
  Administered 2016-02-08 – 2016-02-09 (×2): 60 mg via ORAL
  Filled 2016-02-08 (×2): qty 1

## 2016-02-08 MED ORDER — INSULIN ASPART 100 UNIT/ML ~~LOC~~ SOLN
0.0000 [IU] | Freq: Three times a day (TID) | SUBCUTANEOUS | Status: DC
  Administered 2016-02-08 – 2016-02-09 (×2): 5 [IU] via SUBCUTANEOUS
  Administered 2016-02-09: 9 [IU] via SUBCUTANEOUS
  Administered 2016-02-10: 7 [IU] via SUBCUTANEOUS

## 2016-02-08 MED ORDER — HEPARIN (PORCINE) IN NACL 100-0.45 UNIT/ML-% IJ SOLN
1200.0000 [IU]/h | INTRAMUSCULAR | Status: DC
  Administered 2016-02-08 (×2): 1200 [IU]/h via INTRAVENOUS
  Filled 2016-02-08: qty 250

## 2016-02-08 MED ORDER — PREDNISONE 20 MG PO TABS: 20.0000 mg | ORAL_TABLET | Freq: Two times a day (BID) | ORAL | Status: DC

## 2016-02-08 MED ORDER — SODIUM CHLORIDE 0.9 % IV SOLN: 250.0000 mL | INTRAVENOUS | Status: DC | PRN

## 2016-02-08 MED ORDER — ONDANSETRON HCL 4 MG/2ML IJ SOLN: 4.0000 mg | Freq: Four times a day (QID) | INTRAMUSCULAR | Status: DC | PRN

## 2016-02-08 MED ORDER — LINAGLIPTIN 5 MG PO TABS
5.0000 mg | ORAL_TABLET | Freq: Every day | ORAL | Status: DC
Start: 2016-02-09 — End: 2016-02-10
  Administered 2016-02-09 – 2016-02-10 (×2): 5 mg via ORAL
  Filled 2016-02-08 (×2): qty 1

## 2016-02-08 MED ORDER — ADENOSINE 6 MG/2ML IV SOLN
INTRAVENOUS | Status: AC
  Filled 2016-02-08: qty 2

## 2016-02-08 MED ORDER — ACETAMINOPHEN 325 MG PO TABS: 650.0000 mg | ORAL_TABLET | ORAL | Status: DC | PRN

## 2016-02-08 NOTE — H&P (Signed)
Cardiology History & Physical    Patient ID: Angela [name] MRN: [id number], DOB: June 24, 1961 Date of Encounter: 02/08/2016, 11:57 AM Primary Physician: Cari Caraway, MD Primary Cardiologist: Dr. Lovena Le  Chief Complaint: fatigue, originally presented for outpatient nuc Reason for Admission: atrial fib RVR Requesting MD: Melina Copa PA-C evaluated patient in nuc med  HPI: Angela [name] is a 55 y/o F with history of DM, HTN, HLD, depression, obesity, GERD, temporal arteritis who was seen by Dr. Lovena Le yesterday in the office for pre-operative evaluation and abnormal EKG with inferolateral TWI. She was is planning to undergo a temporal artery biopsy on 7/20. Dr. Lovena Le recommended Lexiscan nuclear stress test. She presented as an outpatient for this today - when hooked up to the monitor she was noted to be in afib RVR with HR 190s-200s, preserved BP. She reports feeling crummy today, overall fatigued particularly with exertion. She can't really pinpoint a time of when this started. She reports intermittent dyspnea which is chronic. She denies any chest pain, nausea, vomiting, orthopnea, syncope or awareness of elevated HR. She denies any history of stroke, ministroke, or any bleeding issues. Nuc med did not have the capacity to treat and monitor the patient down long-term so she was brought to the ER for further evaluation.   She was in NSR yesterday during [place].  Past Medical History  Diagnosis Date  . Diabetes mellitus without complication ([place])   . Hypertension   . Hyperlipidemia   . Depression   . Obesity   . GERD (gastroesophageal reflux disease)   . Heart murmur   . Complication of anesthesia     had epidural for C-section and epidural did not take until after surgery  . Temporal arteritis [facility])      Surgical History:  Past Surgical History  Procedure Laterality Date  . Tubal ligation    . Breast surgery      bilateral breast reduction  . Colonoscopy       Home Meds: Prior to  Admission medications   Medication Sig Start Date End Date Taking? Authorizing Provider  aspirin 81 MG chewable tablet Chew 81 mg by mouth daily.    Historical Provider, MD  Cholecalciferol (VITAMIN D PO) Take 1 tablet by mouth daily.    Historical Provider, MD  CINNAMON PO Take 1 tablet by mouth daily.    Historical Provider, MD  DULoxetine (CYMBALTA) 60 MG capsule Take 60 mg by mouth daily.    Historical Provider, MD  esomeprazole (NEXIUM) 40 MG capsule Take 40 mg by mouth daily.    Historical Provider, MD  folic acid (FOLVITE) 1 MG tablet Take 1 mg by mouth daily.    Historical Provider, MD  glimepiride (AMARYL) 4 MG tablet Take 4 mg by mouth daily.     Historical Provider, MD  Insulin Glargine (BASAGLAR KWIKPEN) 100 UNIT/ML SOPN Inject 30-70 Units into the skin at bedtime. Takes 30 units every night but if she takes prednisone she has to take 70 units    Historical Provider, MD  JANUVIA 100 MG tablet Take 100 mg by mouth daily. 11/12/15   Historical Provider, MD  lisinopril (PRINIVIL,ZESTRIL) 40 MG tablet Take 40 mg by mouth daily.    Historical Provider, MD  pravastatin (PRAVACHOL) 40 MG tablet Take 40 mg by mouth daily.    Historical Provider, MD  predniSONE (DELTASONE) 20 MG tablet Take 20-40 mg by mouth 2 (two) times daily with a meal. Takes 40mg  in the morning and 20mg  at night  Historical Provider, MD  triamterene-hydrochlorothiazide (MAXZIDE-25) 37.5-25 MG tablet Take 1 tablet by mouth daily. 12/08/15   Historical Provider, MD    Allergies:  Allergies  Allergen Reactions  . E-Mycin [Erythromycin] Nausea And Vomiting  . Avocado Diarrhea and Nausea And Vomiting  . Banana Diarrhea and Nausea And Vomiting  . Glucophage [Metformin Hcl] Diarrhea and Nausea And Vomiting  . Kiwi Extract Diarrhea and Nausea And Vomiting  . Mango Flavor Diarrhea  . Levemir [Insulin Detemir] Rash  . Lexapro [Escitalopram Oxalate] Itching and Other (See Comments)    Numb lips    Social History    Social History  . Marital Status: Married    Spouse Name: N/A  . Number of Children: N/A  . Years of Education: N/A   Occupational History  . Not on file.   Social History Main Topics  . Smoking status: Never Smoker   . Smokeless tobacco: Never Used  . Alcohol Use: 0.0 oz/week    0 Standard drinks or equivalent per week     Comment: rare  . Drug Use: No  . Sexual Activity: Not on file   Other Topics Concern  . Not on file   Social History Narrative     Family History  Problem Relation Age of Onset  . CAD      Several aunts had MIs  . Alzheimer's disease    . Diabetes    . Kidney failure Father     Review of Systems: All other systems reviewed and are otherwise negative except as noted above.  Labs:   Lab Results  Component Value Date   WBC 21.9* 02/02/2016   HGB 11.7* 02/02/2016   HCT 37.3 02/02/2016   MCV 86.1 02/02/2016   PLT 369 02/02/2016    Recent Labs Lab 02/02/16 1115  NA 140  K 4.0  CL 103  CO2 29  BUN 22*  CREATININE 0.89  CALCIUM 9.6  GLUCOSE 56*   Radiology/Studies:  No results found. Wt Readings from Last 3 Encounters:  02/07/16 282 lb 6.4 oz (128.096 kg)  02/02/16 282 lb 9.6 oz (128.187 kg)  05/22/14 283 lb 9.6 oz (128.64 kg)    EKG: afib RVR 183bpm, inferolateral TWI (TW changes as noted on prior EKG)   Physical Exam: Pulse 189, BP 159/53, RR 18 General: Well developed obese AAF in no acute distress. Head: Normocephalic, atraumatic, sclera non-icteric, no xanthomas, nares are without discharge.  Neck: JVD not elevated. Lungs: Clear bilaterally to auscultation without wheezes, rales, or rhonchi. Breathing is unlabored. Heart: Irreg irreg, tachycardic, with S1 S2. No murmurs, rubs, or gallops appreciated. Abdomen: Soft, non-tender, non-distended with normoactive bowel sounds. No hepatomegaly. No rebound/guarding. No obvious abdominal masses. Msk:  Strength and tone appear normal for age. Extremities: No clubbing or cyanosis.  No edema (large baseline leg habitus).  Distal pedal pulses are 2+ and equal bilaterally. Neuro: Alert and oriented X 3. No focal deficit. No facial asymmetry. Moves all extremities spontaneously. Psych:  Responds to questions appropriately with a normal affect.    Assessment and Plan   1. Atrial fib with RVR, newly recognized - the duration of this event is less than 48 hours given EKG with NSR yesterday, but it's not really known if she has had any PAF prior to today's recognition since she does not really feel overt awareness of palpitations, just fatigue. CHADSVASC at least 3. Admit, check labs including lytes, CBC, TSH, troponin. With IV diltiazem (10mg  bolus x 2 then a drip)  her HR has come down to the 120s-130s. Will start adjunctive metoprolol 25mg  BID and titrate as needed. Heparin per pharmacy while information is being accumulated regarding further workup.  2. Temporal arteritis - will f/u echo and enzymes to determine what pre-op testing is needed.  3. HTN with suspected hypertensive heart disease - she says she took her BP meds this AM. Hold triamterene/HCTZ to allow room for AVN blocking agents.   4. Hyperlipidemia - continue statin.   5. Morbid obesity - recommend sleep study at dc to eval for OSA possbily contributing to #1.  6. Diabetes mellitus - continue home regimen except will err on lower side of Lantus for now in case there is dietary difference between hospital eating habits and home. (Takes 30 units chronically except 70 units when on prednisone. We can scale up if needed.) Add SSI.  Signed, Charlie Pitter PA-C 02/08/2016, 11:57 AM Pager: [phone]  Patient seen and examined and history reviewed. Agree with above findings and plan. 55 yo BF seen yesterday for evaluation of abnormal Ecg. Set up for outpatient myoview this am. When she arrived she was noted to be in Afib with RVR rate 190. Other than not feeling well she has no specific complaints. No chest pain, dyspnea,  or dizziness.  On exam she is an obese female in NAD Lungs are clear CV IRR without gallop or murmur. No edema.   Ecg shows aFib with RVR and ST-T changes c/w inferolateral ischemia versus repolarization abnormality.  Will admit telemetry. IV heparin and Cardizem. Start PO metoprolol. From her history I suspect she has paroxysmal Afib. Will check Echo. Cycle enzymes. Consider ischemic work up with stress Myoview once arrhythmia controlled. Will eventually need a sleep study.  Peter Martinique, [place] 02/08/2016 1:27 PM

## 2016-02-08 NOTE — ED Notes (Signed)
Pt was upstairs today for a stress test to be cleared by cards for brain surgery. Once pt was hooked up to the monitor they noticed she was in afib rvr with a rate as great as 230. Pt states she was unexplainably weak and tired this morning while getting ready to come to the hospital. Pt was no hx of a fib.

## 2016-02-08 NOTE — Progress Notes (Signed)
PA Dunn notified of pt being back in NSR in the 70s. EKG will be done & in the chart to verify. Will continue to monitor the pt & make any changes if ordered. Hoover Brunette, RN

## 2016-02-08 NOTE — Progress Notes (Signed)
ANTICOAGULATION CONSULT NOTE - FOLLOW UP    HL = 0.95 (goal 0.3 - 0.7 units/mL) Heparin dosing weight = 82 kg   Assessment: 39 YOF with new-onset Afib with RVR to continue on IV heparin.  Heparin level is supra-therapeutic, no bleeding reported.  Heparin level was drawn from the same arm as the heparin infusion; however, it was distal to the IV site.      Plan: - Reduce heparin gtt to 1050 units/hr - Check 6 hr HL    Angela [name] D. Mina Marble, PharmD, BCPS 02/08/2016, 10:08 PM

## 2016-02-08 NOTE — Progress Notes (Signed)
Pt oriented to floor & room. Pt given patient information guide. Pt also notified & informed that she is in a camera room. Will continue to monitor the pt. Hoover Brunette, RN

## 2016-02-08 NOTE — Progress Notes (Signed)
I called Dr. Amie [place] office and spoke with nursing staff to pass on to him that patient has since been admitted with AF RVR and further cardiac workup is in progress, which may delay his plans for surgery 7/20. We can further update his office at dc. Dayna Dunn PA-C

## 2016-02-08 NOTE — Progress Notes (Signed)
ANTICOAGULATION CONSULT NOTE - Initial Consult  Pharmacy Consult for heparin Indication: atrial fibrillation  Allergies  Allergen Reactions  . E-Mycin [Erythromycin] Nausea And Vomiting  . Avocado Diarrhea and Nausea And Vomiting  . Banana Diarrhea and Nausea And Vomiting  . Glucophage [Metformin Hcl] Diarrhea and Nausea And Vomiting  . Kiwi Extract Diarrhea and Nausea And Vomiting  . Mango Flavor Diarrhea  . Levemir [Insulin Detemir] Rash  . Lexapro [Escitalopram Oxalate] Itching and Other (See Comments)    Numb lips    Patient Measurements:   Heparin Dosing Weight: 82.2kg  Vital Signs: Temp: 97.6 F (36.4 C) (07/18 1209) Temp Source: Oral (07/18 1209) BP: 131/82 mmHg (07/18 1245) Pulse Rate: 92 (07/18 1245)  Labs:  Recent Labs  02/08/16 1249 02/08/16 1257  HGB 14.5  --   HCT 45.2  --   PLT 277  --   LABPROT 15.0  --   INR 1.16  --   CREATININE 1.01*  --   TROPONINI  --  0.83*    Estimated Creatinine Clearance: 80.5 mL/min (by C-G formula based on Cr of 1.01).   Medical History: Past Medical History  Diagnosis Date  . Diabetes mellitus without complication ([place])   . Hypertension   . Hyperlipidemia   . Depression   . Obesity   . GERD (gastroesophageal reflux disease)   . Heart murmur   . Complication of anesthesia     had epidural for C-section and epidural did not take until after surgery  . Temporal arteritis ([facility])     Medications:  Scheduled:  . aspirin  81 mg Oral Daily  . DULoxetine  60 mg Oral Daily  . folic acid  1 mg Oral Daily  . [START ON 02/09/2016] glimepiride  4 mg Oral Q breakfast  . heparin  5,000 Units Intravenous Once  . insulin aspart  0-9 Units Subcutaneous TID WC  . [START ON 02/09/2016] insulin glargine  35 Units Subcutaneous QHS  . [START ON 02/09/2016] linagliptin  5 mg Oral Daily  . [START ON 02/09/2016] lisinopril  40 mg Oral Daily  . metoprolol tartrate  25 mg Oral BID  . [START ON 02/09/2016] pantoprazole  80 mg Oral  Q1200  . [START ON 02/09/2016] pravastatin  40 mg Oral Daily  . predniSONE  20 mg Oral Q supper  . [START ON 02/09/2016] predniSONE  40 mg Oral Q breakfast  . sodium chloride flush  3 mL Intravenous Q12H    Assessment: 74 YOF with Atrial fib with RVR; newly recognized. To start heparin per pharmacy. No PTA anticoagulation. H&H 14.5/45.2, plt 277  Goal of Therapy:  Heparin level 0.3-0.7 units/ml Monitor platelets by anticoagulation protocol: Yes   Plan:  -Heparin bolus 5000 units x1 -Heparin gtt at 1200 units/hr -6hr HL -Daily HL/CBC -Monitor s/sx bleeding  Stephens November, PharmD Clinical Pharmacist   3:26 PM, 02/08/2016

## 2016-02-08 NOTE — Progress Notes (Addendum)
Tele reviewed. Pt converted to NSR at 1800. She got dose of metoprolol 25mg  this afternoon. This is ordered for BID as previously discussed. Dilt gtt was at 20mg /hr at time of conversion. I think she'll probably get adjunctive benefit from the BB so in converting the diltiazem to oral form, will scale back diltiazem dose to 60mg  q6hr. Wrote nurse order to turn off dilt gtt 1 hour after giving oral form. Hold parameters listed on both meds. Will also hold ACEI in case we need to titrate meds  Consuella Scurlock PA-C

## 2016-02-08 NOTE — ED Notes (Signed)
BOLUS OF 10MG  GIVEN PER DR. Martinique.

## 2016-02-08 NOTE — ED Notes (Signed)
Called Cards and informed them of elevated troponin.

## 2016-02-09 ENCOUNTER — Observation Stay (HOSPITAL_BASED_OUTPATIENT_CLINIC_OR_DEPARTMENT_OTHER): Payer: 59

## 2016-02-09 ENCOUNTER — Encounter (HOSPITAL_COMMUNITY): Payer: 59

## 2016-02-09 ENCOUNTER — Encounter (HOSPITAL_COMMUNITY): Payer: Self-pay | Admitting: Cardiovascular Disease

## 2016-02-09 ENCOUNTER — Encounter (HOSPITAL_COMMUNITY)
Admission: EM | Disposition: A | Discharge status: Home / Self Care | Payer: Self-pay | Source: Home / Self Care | Attending: Emergency Medicine

## 2016-02-09 DIAGNOSIS — I4891 Unspecified atrial fibrillation: Secondary | ICD-10-CM | POA: Diagnosis not present

## 2016-02-09 DIAGNOSIS — R778 Other specified abnormalities of plasma proteins: Secondary | ICD-10-CM | POA: Insufficient documentation

## 2016-02-09 DIAGNOSIS — R7989 Other specified abnormal findings of blood chemistry: Secondary | ICD-10-CM

## 2016-02-09 DIAGNOSIS — E785 Hyperlipidemia, unspecified: Secondary | ICD-10-CM | POA: Diagnosis not present

## 2016-02-09 DIAGNOSIS — I10 Essential (primary) hypertension: Secondary | ICD-10-CM | POA: Diagnosis not present

## 2016-02-09 DIAGNOSIS — E1165 Type 2 diabetes mellitus with hyperglycemia: Secondary | ICD-10-CM | POA: Diagnosis not present

## 2016-02-09 HISTORY — PX: CARDIAC CATHETERIZATION: SHX172

## 2016-02-09 LAB — CBC
HCT: 35.7 % — ABNORMAL LOW (ref 36.0–46.0)
Hemoglobin: 11.3 g/dL — ABNORMAL LOW (ref 12.0–15.0)
MCH: 27.1 pg (ref 26.0–34.0)
MCHC: 31.7 g/dL (ref 30.0–36.0)
MCV: 85.6 fL (ref 78.0–100.0)
PLATELETS: 273 10*3/uL (ref 150–400)
RBC: 4.17 MIL/uL (ref 3.87–5.11)
RDW: 14.5 % (ref 11.5–15.5)
WBC: 15.5 10*3/uL — ABNORMAL HIGH (ref 4.0–10.5)

## 2016-02-09 LAB — BASIC METABOLIC PANEL
Anion gap: 6 (ref 5–15)
BUN: 20 mg/dL (ref 6–20)
CALCIUM: 9.1 mg/dL (ref 8.9–10.3)
CO2: 27 mmol/L (ref 22–32)
CREATININE: 0.88 mg/dL (ref 0.44–1.00)
Chloride: 103 mmol/L (ref 101–111)
GFR calc Af Amer: >60 mL/min (ref >60)
GLUCOSE: 247 mg/dL — AB (ref 65–99)
Potassium: 4.2 mmol/L (ref 3.5–5.1)
Sodium: 136 mmol/L (ref 135–145)

## 2016-02-09 LAB — ECHOCARDIOGRAM COMPLETE
Height: 62 in
Weight: 4430.36 oz

## 2016-02-09 LAB — LIPID PANEL
Cholesterol: 172 mg/dL (ref 0–200)
HDL: 72 mg/dL (ref >40)
LDL Cholesterol: 81 mg/dL (ref 0–99)
Total CHOL/HDL Ratio: 2.4 RATIO
Triglycerides: 94 mg/dL (ref <150)
VLDL: 19 mg/dL (ref 0–40)

## 2016-02-09 LAB — HEPARIN LEVEL (UNFRACTIONATED): HEPARIN UNFRACTIONATED: 0.76 [IU]/mL — AB (ref 0.30–0.70)

## 2016-02-09 LAB — GLUCOSE, CAPILLARY
GLUCOSE-CAPILLARY: 275 mg/dL — AB (ref 65–99)
Glucose-Capillary: 395 mg/dL — ABNORMAL HIGH (ref 65–99)
Glucose-Capillary: 451 mg/dL — ABNORMAL HIGH (ref 65–99)

## 2016-02-09 LAB — TROPONIN I
TROPONIN I: 0.46 ng/mL — AB (ref <0.03)
TROPONIN I: 0.49 ng/mL — AB (ref <0.03)
TROPONIN I: 0.63 ng/mL — AB (ref <0.03)
TROPONIN I: 0.83 ng/mL — AB (ref <0.03)

## 2016-02-09 SURGERY — LEFT HEART CATH AND CORONARY ANGIOGRAPHY

## 2016-02-09 MED ORDER — SODIUM CHLORIDE 0.9% FLUSH
3.0000 mL | Freq: Two times a day (BID) | INTRAVENOUS | Status: DC
  Administered 2016-02-09: 3 mL via INTRAVENOUS

## 2016-02-09 MED ORDER — SODIUM CHLORIDE 0.9% FLUSH: 3.0000 mL | Freq: Two times a day (BID) | INTRAVENOUS | Status: DC

## 2016-02-09 MED ORDER — SODIUM CHLORIDE 0.9% FLUSH: 3.0000 mL | INTRAVENOUS | Status: DC | PRN

## 2016-02-09 MED ORDER — SODIUM CHLORIDE 0.9 % IV SOLN: 250.0000 mL | INTRAVENOUS | Status: DC | PRN

## 2016-02-09 MED ORDER — HEPARIN (PORCINE) IN NACL 2-0.9 UNIT/ML-% IJ SOLN
INTRAMUSCULAR | Status: AC
  Filled 2016-02-09: qty 1500

## 2016-02-09 MED ORDER — SODIUM CHLORIDE 0.9 % WEIGHT BASED INFUSION: 1.0000 mL/kg/h | INTRAVENOUS | Status: DC

## 2016-02-09 MED ORDER — VERAPAMIL HCL 2.5 MG/ML IV SOLN
INTRAVENOUS | Status: AC
  Filled 2016-02-09: qty 2

## 2016-02-09 MED ORDER — DILTIAZEM HCL ER COATED BEADS 120 MG PO CP24
120.0000 mg | ORAL_CAPSULE | Freq: Every day | ORAL | Status: DC
  Administered 2016-02-09 – 2016-02-10 (×2): 120 mg via ORAL
  Filled 2016-02-09 (×2): qty 1

## 2016-02-09 MED ORDER — LIDOCAINE HCL (PF) 1 % IJ SOLN
INTRAMUSCULAR | Status: DC | PRN
  Administered 2016-02-09: 2 mL via INTRADERMAL

## 2016-02-09 MED ORDER — LIDOCAINE HCL (PF) 1 % IJ SOLN
INTRAMUSCULAR | Status: AC
  Filled 2016-02-09: qty 30

## 2016-02-09 MED ORDER — HEPARIN SODIUM (PORCINE) 1000 UNIT/ML IJ SOLN
INTRAMUSCULAR | Status: DC | PRN
Start: 2016-02-09 — End: 2016-02-09
  Administered 2016-02-09: 5000 [IU] via INTRAVENOUS

## 2016-02-09 MED ORDER — MIDAZOLAM HCL 2 MG/2ML IJ SOLN
INTRAMUSCULAR | Status: DC | PRN
  Administered 2016-02-09 (×2): 1 mg via INTRAVENOUS

## 2016-02-09 MED ORDER — RIVAROXABAN 20 MG PO TABS
20.0000 mg | ORAL_TABLET | Freq: Every day | ORAL | Status: DC
  Administered 2016-02-09: 20 mg via ORAL
  Filled 2016-02-09: qty 1

## 2016-02-09 MED ORDER — MIDAZOLAM HCL 2 MG/2ML IJ SOLN
INTRAMUSCULAR | Status: AC
  Filled 2016-02-09: qty 2

## 2016-02-09 MED ORDER — FENTANYL CITRATE (PF) 100 MCG/2ML IJ SOLN
INTRAMUSCULAR | Status: DC | PRN
  Administered 2016-02-09 (×2): 25 ug via INTRAVENOUS

## 2016-02-09 MED ORDER — SODIUM CHLORIDE 0.9 % WEIGHT BASED INFUSION: 3.0000 mL/kg/h | INTRAVENOUS | Status: AC

## 2016-02-09 MED ORDER — VERAPAMIL HCL 2.5 MG/ML IV SOLN
INTRAVENOUS | Status: DC | PRN
  Administered 2016-02-09: 10 mL via INTRA_ARTERIAL

## 2016-02-09 MED ORDER — FENTANYL CITRATE (PF) 100 MCG/2ML IJ SOLN
INTRAMUSCULAR | Status: AC
  Filled 2016-02-09: qty 2

## 2016-02-09 MED ORDER — HEPARIN SODIUM (PORCINE) 1000 UNIT/ML IJ SOLN
INTRAMUSCULAR | Status: AC
  Filled 2016-02-09: qty 1

## 2016-02-09 MED ORDER — HEPARIN (PORCINE) IN NACL 2-0.9 UNIT/ML-% IJ SOLN
INTRAMUSCULAR | Status: DC | PRN
  Administered 2016-02-09: 1500 mL

## 2016-02-09 MED ORDER — SODIUM CHLORIDE 0.9 % IV SOLN: INTRAVENOUS | Status: AC

## 2016-02-09 SURGICAL SUPPLY — 14 items
CATH INFINITI 5 FR AR1 MOD (CATHETERS) ×1 IMPLANT
CATH INFINITI 5 FR JL3.5 (CATHETERS) ×1 IMPLANT
CATH INFINITI 5 FR RCB (CATHETERS) ×1 IMPLANT
CATH INFINITI JR4 5F (CATHETERS) ×1 IMPLANT
CATH LAUNCHER 5F JR4 (CATHETERS) ×1 IMPLANT
DEVICE RAD COMP TR BAND LRG (VASCULAR PRODUCTS) ×1 IMPLANT
GLIDESHEATH SLEND SS 6F .021 (SHEATH) ×2 IMPLANT
KIT ESSENTIALS PG (KITS) ×1 IMPLANT
KIT HEART LEFT (KITS) ×2 IMPLANT
PACK CARDIAC CATHETERIZATION (CUSTOM PROCEDURE TRAY) ×2 IMPLANT
SYR MEDRAD MARK V 150ML (SYRINGE) ×2 IMPLANT
TRANSDUCER W/STOPCOCK (MISCELLANEOUS) ×2 IMPLANT
TUBING CIL FLEX 10 FLL-RA (TUBING) ×2 IMPLANT
WIRE SAFE-T 1.5MM-J .035X260CM (WIRE) ×1 IMPLANT

## 2016-02-09 NOTE — Progress Notes (Signed)
TELEMETRY: Reviewed telemetry pt in NSR, converted from Afib at 6 pm yesterday: Filed Vitals:   02/08/16 2323 02/09/16 0351 02/09/16 0730 02/09/16 0944  BP: 116/65 110/68  124/70  Pulse: 66 77  83  Temp: 97.7 F (36.5 C) 98.1 F (36.7 C) 98.2 F (36.8 C)   TempSrc: Oral Oral Oral   Resp: 18 18    Height:      Weight:  276 lb 14.4 oz (125.601 kg)    SpO2: 98% 98%      Intake/Output Summary (Last 24 hours) at 02/09/16 1042 Last data filed at 02/09/16 1000  Gross per 24 hour  Intake    720 ml  Output   1200 ml  Net   -480 ml   Filed Weights   02/08/16 1530 02/09/16 0351  Weight: 276 lb 3.2 oz (125.283 kg) 276 lb 14.4 oz (125.601 kg)    Subjective She is feeling much better. No chest pain or SOB.   Marland Kitchen aspirin  81 mg Oral Daily  . diltiazem  60 mg Oral Q6H  . DULoxetine  60 mg Oral Daily  . folic acid  1 mg Oral Daily  . glimepiride  4 mg Oral Q breakfast  . insulin aspart  0-9 Units Subcutaneous TID WC  . insulin glargine  35 Units Subcutaneous QHS  . linagliptin  5 mg Oral Daily  . metoprolol tartrate  25 mg Oral BID  . off the beat book   Does not apply Once  . pantoprazole  80 mg Oral Q1200  . pravastatin  40 mg Oral Daily  . predniSONE  20 mg Oral Q supper  . predniSONE  40 mg Oral Q breakfast  . sodium chloride flush  3 mL Intravenous Q12H   . heparin 900 Units/hr (02/09/16 1000)    LABS: Basic Metabolic Panel:  Recent Labs  02/08/16 1249 02/09/16 0734  NA 138 136  K 4.5 4.2  CL 106 103  CO2 24 27  GLUCOSE 263* 247*  BUN 23* 20  CREATININE 1.01* 0.88  CALCIUM 9.8 9.1  MG 1.8  --    Liver Function Tests:  Recent Labs  02/08/16 1257  AST 98*  ALT 113*  ALKPHOS 68  BILITOT 0.7  PROT 6.8  ALBUMIN 3.3*   No results for input(s): LIPASE, AMYLASE in the last 72 hours. CBC:  Recent Labs  02/08/16 1249 02/09/16 0734  WBC 12.8* 15.5*  HGB 14.5 11.3*  HCT 45.2 35.7*  MCV 86.3 85.6  PLT 277 273   Cardiac Enzymes:  Recent Labs  02/08/16 1916 02/09/16 0033 02/09/16 0734  TROPONINI 0.84* 0.83* 0.63*   BNP: No results for input(s): PROBNP in the last 72 hours. D-Dimer: No results for input(s): DDIMER in the last 72 hours. Hemoglobin A1C: No results for input(s): HGBA1C in the last 72 hours. Fasting Lipid Panel: No results for input(s): CHOL, HDL, LDLCALC, TRIG, CHOLHDL, LDLDIRECT in the last 72 hours. Thyroid Function Tests:  Recent Labs  02/08/16 1256  TSH 0.753     Radiology/Studies:  Dg Chest Portable 1 View  02/08/2016  CLINICAL DATA:  Atrial fibrillation.  Tachycardia.  Weakness. EXAM: PORTABLE CHEST 1 VIEW COMPARISON:  09/28/2005 FINDINGS: Heart size within normal limits for the AP projection. The lungs appear clear. Mediastinum unremarkable. IMPRESSION: 1.  No significant abnormality identified. Electronically Signed   By: Van Clines M.D.   On: 02/08/2016 13:10    PHYSICAL EXAM General: Well developed, obese AAF, in no acute  distress. Head: Normocephalic, atraumatic, sclera non-icteric, oropharynx is clear Neck: Negative for carotid bruits. JVD not elevated. No adenopathy Lungs: Clear bilaterally to auscultation without wheezes, rales, or rhonchi. Breathing is unlabored. Heart: RRR S1 S2 without murmurs, rubs, or gallops.  Abdomen: Soft, non-tender, non-distended with normoactive bowel sounds. No hepatomegaly. No rebound/guarding. No obvious abdominal masses. Msk:  Strength and tone appears normal for age. Extremities: No clubbing, cyanosis or edema.  Distal pedal pulses are 2+ and equal bilaterally. Neuro: Alert and oriented X 3. Moves all extremities spontaneously. Psych:  Responds to questions appropriately with a normal affect.  ASSESSMENT AND PLAN: 1. Atrial fibrillation with RVR. Now converted to NSR. Possibly related to hypertensive heart disease versus ischemia. Echo is pending. Troponins are positive. Will continue po metoprolol and cardizem. Switch to cardizem CD. [place] vasc  score of at least 3. Will need long term anticoagulation pending results of cardiac evaluation 2. Elevated troponin. This may be demand ischemia in setting of rapid HR. Patient does have multiple cardiac risk factors and abnormal resting Ecg. Was initially scheduled for Myoview study as outpatient but this was cancelled due to Afib with RVR. I have recommended proceeding with cardiac cath to definitively define coronary risk. The procedure and risks were reviewed including but not limited to death, myocardial infarction, stroke, arrythmias, bleeding, transfusion, emergency surgery, dye allergy, or renal dysfunction. The patient voices understanding and is agreeable to proceed.. 3. HTN with hypertensive heart disease. Echo is pending. 4. DM on insulin. BS elevated on steroids. Will increase pm Lantus dose. Continue SSI. 5. Morbid obesity. Consider sleep study as outpatient. 6. Temporal arteritis. On steroids. Temporal artery Biopsy planned depending on cardiac work up.  7. HLD on statin. If she has CAD will need to change to high dose statin.  Present on Admission:  . Atrial fibrillation with RVR ([place])  Signed, Peter Martinique, [place] 02/09/2016 10:42 AM

## 2016-02-09 NOTE — Research (Signed)
LEADERS FREE II Informed Consent   Subject Name: Angela [name]  Subject met inclusion and exclusion criteria.  The informed consent form, study requirements and expectations were reviewed with the subject and questions and concerns were addressed prior to the signing of the consent form.  The subject verbalized understanding of the trial requirements.  The subject agreed to participate in the LEADERS FREE II trial and signed the informed consent.  The informed consent was obtained prior to performance of any protocol-specific procedures for the subject.  A copy of the signed informed consent was given to the subject and a copy was placed in the subject's medical record. If [name] meets angiographic criteria she can be a potential LEADERS FREE candidate.  Berneda Rose 02/09/2016, 12:46 PM

## 2016-02-09 NOTE — Progress Notes (Signed)
ANTICOAGULATION CONSULT NOTE - FOLLOW UP    HL = 0.76 (goal 0.3 - 0.7 units/mL) Heparin dosing weight = 82 kg SCr= 0.88   Assessment: 55 YOF with new-onset Afib with RVR to continue on IV heparin. No PTA anticoagulation.  Heparin level remains supra-therapeutic, no bleeding reported.   HgB/ HCT- 11.3/35.6 Plt- 273  Decrease in hemoglobin from 14.5 to 11.3. Spoke with patient and nurse and no bleeding was noted from site/in stool etc. Will monitor closely.  Plan: - Reduce heparin gtt to 900 units/hr - Check 6 hr HL - Monitor CBC and s/sx bleeding     Uvaldo Bristle, Sherian Rein D Pharmacy Resident   02/09/2016, 9:28 AM

## 2016-02-09 NOTE — Progress Notes (Addendum)
Inpatient Diabetes Program Recommendations  AACE/ADA: New Consensus Statement on Inpatient Glycemic Control (2015)  Target Ranges:  Prepandial:   less than 140 mg/dL      Peak postprandial:   less than 180 mg/dL (1-2 hours)      Critically ill patients:  140 - 180 mg/dL   Lab Results  Component Value Date   GLUCAP 275* 02/09/2016   HGBA1C 12.5* 02/02/2016    Review of Glycemic Control:  Results for ASCENCION, SCOGGINS (MRN [id number]) as of 02/09/2016 15:00  Ref. Range 02/08/2016 16:38 02/08/2016 20:01 02/08/2016 22:17 02/09/2016 07:37  Glucose-Capillary Latest Ref Range: 65-99 mg/dL 299 (H) 431 (H) 385 (H) 275 (H)  Results for CLARIBEL, BOTA (MRN [id number]) as of 02/09/2016 15:00  Ref. Range 02/02/2016 11:15  Hemoglobin A1C Latest Ref Range: 4.8-5.6 % 12.5 (H)   Diabetes history: Type 2 diabetes Outpatient Diabetes medications: Amaryl 4 mg daily, Basaglar 30 units q HS (however patient takes Basaglar 70 units q HS while on steroids) Current orders for Inpatient glycemic control:  Lantus 35 units q HS, Amaryl 4 mg daily, Novolog sensitive tid with meals-Prednisone 40 mg with breakfast and 20 mg with supper Inpatient Diabetes Program Recommendations:   Since patient is on steroids, consider increasing dose of Lantus to 55 units q HS.  Also may consider adding Novolog meal coverage 5 units tid with meals. A1C indicates poor control of diabetes prior to admit.  Will follow. Needs follow-up with PCP regarding A1C after discharge.   Thanks, Adah Perl, RN, BC-ADM Inpatient Diabetes Coordinator Pager [phone] (8a-5p)

## 2016-02-10 ENCOUNTER — Ambulatory Visit (HOSPITAL_COMMUNITY): Admission: RE | Admit: 2016-02-10 | Payer: 59 | Source: Ambulatory Visit | Admitting: General Surgery

## 2016-02-10 ENCOUNTER — Encounter: Payer: Self-pay | Admitting: Physician Assistant

## 2016-02-10 ENCOUNTER — Encounter (HOSPITAL_COMMUNITY): Admission: RE | Payer: Self-pay | Source: Ambulatory Visit

## 2016-02-10 DIAGNOSIS — E1165 Type 2 diabetes mellitus with hyperglycemia: Secondary | ICD-10-CM | POA: Diagnosis not present

## 2016-02-10 DIAGNOSIS — M316 Other giant cell arteritis: Secondary | ICD-10-CM | POA: Diagnosis not present

## 2016-02-10 DIAGNOSIS — I4891 Unspecified atrial fibrillation: Secondary | ICD-10-CM | POA: Diagnosis not present

## 2016-02-10 DIAGNOSIS — I10 Essential (primary) hypertension: Secondary | ICD-10-CM | POA: Diagnosis not present

## 2016-02-10 LAB — GLUCOSE, CAPILLARY: Glucose-Capillary: 319 mg/dL — ABNORMAL HIGH (ref 65–99)

## 2016-02-10 LAB — TROPONIN I
TROPONIN I: 0.4 ng/mL — AB (ref <0.03)
Troponin I: 0.41 ng/mL (ref <0.03)

## 2016-02-10 SURGERY — BIOPSY TEMPORAL ARTERY
Anesthesia: Choice

## 2016-02-10 MED ORDER — DILTIAZEM HCL ER COATED BEADS 120 MG PO CP24: 120.0000 mg | ORAL_CAPSULE | Freq: Every day | ORAL | Status: DC

## 2016-02-10 MED ORDER — METOPROLOL TARTRATE 25 MG PO TABS: 25.0000 mg | ORAL_TABLET | Freq: Two times a day (BID) | ORAL | Status: DC

## 2016-02-10 MED ORDER — RIVAROXABAN 20 MG PO TABS: 20.0000 mg | ORAL_TABLET | Freq: Every day | ORAL | Status: DC

## 2016-02-10 NOTE — Progress Notes (Signed)
Inpatient Diabetes Program Recommendations  AACE/ADA: New Consensus Statement on Inpatient Glycemic Control (2015)  Target Ranges:  Prepandial:   less than 140 mg/dL      Peak postprandial:   less than 180 mg/dL (1-2 hours)      Critically ill patients:  140 - 180 mg/dL   Lab Results  Component Value Date   GLUCAP 319* 02/10/2016   HGBA1C 12.5* 02/02/2016    Spoke with patient regarding A1C.  Gave handout regarding A1C results and standards of care.  Patients states that blood sugars are difficult to control while on steroids.  Explained that she likely needs rapid acting insulin to go along with basal insulin while on steroids.  She states that she has an appointment with her PCP at the end of next month.  Encouraged her to call today for sooner appointment due to elevated blood sugars and A1C.  Patient verbalized understanding and states "I will call them today".  Thanks, Adah Perl, RN, BC-ADM Inpatient Diabetes Coordinator Pager [phone] (8a-5p)

## 2016-02-10 NOTE — Progress Notes (Signed)
Patient Name: Angela [name] Date of Encounter: 02/10/2016  Hospital Problem List     Principal Problem:   New onset atrial fibrillation [facility]) Active Problems:   Temporal arteritis ([place])   Essential hypertension   Hypertensive heart disease   Hyperlipidemia   Morbid obesity ([place])   Diabetes mellitus type 2, uncontrolled ([place])   Atrial fibrillation with RVR ([facility])   Elevated troponin level    Subjective   Feeling well this morning. No reports of chest pain or dyspnea. No headache.   Inpatient Medications    . diltiazem  120 mg Oral Daily  . DULoxetine  60 mg Oral Daily  . folic acid  1 mg Oral Daily  . glimepiride  4 mg Oral Q breakfast  . insulin aspart  0-9 Units Subcutaneous TID WC  . insulin glargine  35 Units Subcutaneous QHS  . linagliptin  5 mg Oral Daily  . metoprolol tartrate  25 mg Oral BID  . off the beat book   Does not apply Once  . pantoprazole  80 mg Oral Q1200  . pravastatin  40 mg Oral Daily  . predniSONE  20 mg Oral Q supper  . predniSONE  40 mg Oral Q breakfast  . rivaroxaban  20 mg Oral Q supper  . sodium chloride flush  3 mL Intravenous Q12H  . sodium chloride flush  3 mL Intravenous Q12H    Vital Signs    Filed Vitals:   02/09/16 1419 02/09/16 1644 02/09/16 2001 02/10/16 0516  BP:  141/78 122/78 134/73  Pulse:   83 77  Temp: 98.5 F (36.9 C)  98.1 F (36.7 C) 97.7 F (36.5 C)  TempSrc: Oral  Oral Oral  Resp: 20  20 20   Height:      Weight:    278 lb 9.6 oz (126.372 kg)  SpO2: 99%  95% 100%    Intake/Output Summary (Last 24 hours) at 02/10/16 0759 Last data filed at 02/10/16 0510  Gross per 24 hour  Intake    243 ml  Output   1800 ml  Net  -1557 ml   Filed Weights   02/09/16 0351 02/09/16 1129 02/10/16 0516  Weight: 276 lb 14.4 oz (125.601 kg) 276 lb 14.4 oz (125.6 kg) 278 lb 9.6 oz (126.372 kg)    Physical Exam    General: Pleasant, obese AA female, NAD. Neuro: Alert and oriented X 3. Moves all extremities  spontaneously. Psych: Normal affect. HEENT:  Normal  Neck: Supple without bruits or JVD. Lungs:  Resp regular and unlabored, CTA. Heart: RRR no s3, s4, or murmurs. Abdomen: Soft, non-tender, non-distended, BS + x 4.  Extremities: No clubbing, cyanosis or edema. DP/PT/Radials 2+ and equal bilaterally. Right radial cath site stable.   Labs    CBC  Recent Labs  02/08/16 1249 02/09/16 0734  WBC 12.8* 15.5*  HGB 14.5 11.3*  HCT 45.2 35.7*  MCV 86.3 85.6  PLT 277 123456   Basic Metabolic Panel  Recent Labs  02/08/16 1249 02/09/16 0734  NA 138 136  K 4.5 4.2  CL 106 103  CO2 24 27  GLUCOSE 263* 247*  BUN 23* 20  CREATININE 1.01* 0.88  CALCIUM 9.8 9.1  MG 1.8  --    Liver Function Tests  Recent Labs  02/08/16 1257  AST 98*  ALT 113*  ALKPHOS 68  BILITOT 0.7  PROT 6.8  ALBUMIN 3.3*   No results for input(s): LIPASE, AMYLASE in the last 72 hours.  Cardiac Enzymes  Recent Labs  02/09/16 1238 02/09/16 1833 02/10/16 0306  TROPONINI 0.49* 0.46* 0.41*   BNP Invalid input(s): POCBNP D-Dimer No results for input(s): DDIMER in the last 72 hours. Hemoglobin A1C No results for input(s): HGBA1C in the last 72 hours. Fasting Lipid Panel  Recent Labs  02/09/16 0734  CHOL 172  HDL 72  LDLCALC 81  TRIG 94  CHOLHDL 2.4   Thyroid Function Tests  Recent Labs  02/08/16 1256  TSH 0.753    Telemetry    SR with 4 beat NSVT, asymptomatic  ECG    None  Radiology    TTE: 02/09/2016  Study Conclusions  - Left ventricle: The cavity size was normal. Wall thickness was  increased in a pattern of moderate LVH. Systolic function was  normal. The estimated ejection fraction was in the range of 60%  to 65%. Wall motion was normal; there were no regional wall  motion abnormalities. Doppler parameters are consistent with  abnormal left ventricular relaxation (grade 1 diastolic  dysfunction). The E/e&' ratio is >15, suggesting elevated LV  filling  pressure. - Aortic valve: Trileaflet. Sclerosis without stenosis. There was  no regurgitation. - Mitral valve: Calcified annulus. Mildly thickened leaflets .  There was trivial regurgitation. - Left atrium: The atrium was mildly dilated. - Tricuspid valve: There was trivial regurgitation. - Pulmonary arteries: PA peak pressure: 23 mm Hg (S). - Inferior vena cava: The vessel was normal in size. The  respirophasic diameter changes were in the normal range (>= 50%),  consistent with normal central venous pressure.  Impressions:  - LVEF 60-65%, moderate LVH, normal wall motion, diastolic  dysfunction with elevated LV filling pressure, trivial MR, mildly  dilated LA, trivial TR, RVSP 23 mmHg, normal IVC.   Assessment & Plan    55 yo female with PMH of DM, HTN, HLD, depression, obesity, GERD, temporal arteritis who was seen by Dr. Lovena Le in the office 02/07/2016 for pre-operative evaluation and abnormal EKG. Was sent for outpatient lexiscan, and noted to be in A-fib RVR in nuc med. Was transported to the ED and admit for further work up.   1. Atrial fibrillation with RVR: Now converted to NSR. Possibly related to hypertensive heart disease versus ischemia. Echo is pending. Troponins are positive. Will continue po metoprolol and cardizem. Switched to cardizem CD. [place] vasc score of at least 3.  -- started on Xarelto yesterday afternoon post cath.  2. Elevated troponin: This is likely demand ischemia in setting of rapid HR. Patient does have multiple cardiac risk factors and abnormal resting Ecg. Was initially scheduled for Myoview study as outpatient but this was cancelled due to Afib with RVR. -- She underwent cath yesterday showing normal coronaries.  3. HTN with hypertensive heart disease: Echo shows EF 60-65%, moderate LVH, NWMA, and G1DD  4. DM on insulin: BS elevated on steroids. Continue SSI. Diabetic coordinator recommends increasing Lantus to 55 units q HS. Will need follow up  with PCP.   5. Morbid obesity: Consider sleep study as outpatient.  6. Temporal arteritis: On steroids. Temporal artery Biopsy planned depending on cardiac work up. No headache this morning.  7. HLD on statin: No CAD, continue current statin dose.  Signed, Reino Bellis NP-C Pager [phone] Patient seen and examined and history reviewed. Agree with above findings and plan. Feeling well. No arrhythmia. Cardiac cath and echo unremarkable. Will DC home today on Xarelto, beta blocker and cardizem. She can reschedule her temporal artery biopsy. She is low  risk. Once biopsy scheduled would stop Xarelto 48 hours prior. Follow up with Dr. Lovena Le.  Janis Sol Martinique, [place] 02/10/2016 9:43 AM

## 2016-02-10 NOTE — Care Management Note (Signed)
Case Management Note  Patient Details  Name: [name] MRN: [id number] Date of Birth: 05-21-1961  Subjective/Objective: Pt presented for New onset afib. Pt is on Xarelto. Pt was provided 30 day free card.                    Action/Plan: S/W MAGGIE @ [facility] # [id number]   XARELTO 20 MG PO DAILY (30 )  COVER- YES  CO-PAY- ZERO DOLLARS  PRIOR APPROVAL- NO  PHARMACY: [facility]   Expected Discharge Date:                  Expected Discharge Plan:  Home/Self Care  In-House Referral:  NA  Discharge planning Services  CM Consult, Medication Assistance  Post Acute Care Choice:  NA Choice offered to:  NA  DME Arranged:  N/A DME Agency:  NA  HH Arranged:  NA HH Agency:  NA  Status of Service:  Completed, signed off  If discussed at [place] of Stay Meetings, dates discussed:    Additional Comments:  Bethena Roys, RN 02/10/2016, 3:00 PM

## 2016-02-10 NOTE — Discharge Summary (Signed)
Discharge Summary    Patient ID: Angela [name],  MRN: [id number], DOB/AGE: 1961-02-24 54 y.o.  Admit date: 02/08/2016 Discharge date: 02/10/2016  Primary Care Provider: [facility] Primary Cardiologist: Dr. Lovena Le   Discharge Diagnoses    Principal Problem:   New onset atrial fibrillation [facility]) Active Problems:   Temporal arteritis ([place])   Essential hypertension   Hypertensive heart disease   Hyperlipidemia   Morbid obesity ([place])   Diabetes mellitus type 2, uncontrolled ([place])   Atrial fibrillation with RVR ([facility])   Elevated troponin level   Allergies Allergies  Allergen Reactions  . E-Mycin [Erythromycin] Nausea And Vomiting  . Avocado Diarrhea and Nausea And Vomiting  . Banana Diarrhea and Nausea And Vomiting  . Glucophage [Metformin Hcl] Diarrhea and Nausea And Vomiting  . Kiwi Extract Diarrhea and Nausea And Vomiting  . Mango Flavor Diarrhea  . Levemir [Insulin Detemir] Rash  . Lexapro [Escitalopram Oxalate] Itching and Other (See Comments)    Numb lips    Diagnostic Studies/Procedures    Echo: 02/09/2016  Study Conclusions  - Left ventricle: The cavity size was normal. Wall thickness was  increased in a pattern of moderate LVH. Systolic function was  normal. The estimated ejection fraction was in the range of 60%  to 65%. Wall motion was normal; there were no regional wall  motion abnormalities. Doppler parameters are consistent with  abnormal left ventricular relaxation (grade 1 diastolic  dysfunction). The E/e&' ratio is >15, suggesting elevated LV  filling pressure. - Aortic valve: Trileaflet. Sclerosis without stenosis. There was  no regurgitation. - Mitral valve: Calcified annulus. Mildly thickened leaflets .  There was trivial regurgitation. - Left atrium: The atrium was mildly dilated. - Tricuspid valve: There was trivial regurgitation. - Pulmonary arteries: PA peak pressure: 23 mm Hg (S). - Inferior vena cava: The vessel was  normal in size. The  respirophasic diameter changes were in the normal range (>= 50%),  consistent with normal central venous pressure.  Impressions:  - LVEF 60-65%, moderate LVH, normal wall motion, diastolic  dysfunction with elevated LV filling pressure, trivial MR, mildly  dilated LA, trivial TR, RVSP 23 mmHg, normal IVC.  LHC: 02/09/2016  Conclusion    1. Angiographically normal coronary arteries 2. Elevated LVEDP  Recommend: Suspect demand ischemia from AFib with RVR. Start Xarelto tonight.      _____________   History of Present Illness     Angela [name] is a 55 y/o F with history of DM, HTN, HLD, depression, obesity, GERD, temporal arteritis who was seen by Dr. Lovena Le on 02/07/2016 in the office for pre-operative evaluation and abnormal EKG with inferolateral TWI. She was is planning to undergo a temporal artery biopsy on 7/20. Dr. Lovena Le recommended Lexiscan nuclear stress test. She presented as an outpatient for this on 02/08/2016 - when hooked up to the monitor she was noted to be in afib RVR with HR 190s-200s, preserved BP. She reported feeling crummy that morning, overall fatigued particularly with exertion. She can't really pinpoint a time of when this started. She reports intermittent dyspnea which is chronic. She denies any chest pain, nausea, vomiting, orthopnea, syncope or awareness of elevated HR. She denies any history of stroke, ministroke, or any bleeding issues. Nuc med did not have the capacity to treat and monitor the patient down long-term so she was brought to the ER for further evaluation.   Hospital Course     Consultants: Diabetes Coordinator   In the ED she was placed  in a room and started on IV diltiazem, given 2 10mg  boluses which reduced her heart rate into the 120-130s. She was also started on metoprolol 25mg  BID, along with heparin. Telemetry showed that she converted to NSR at 1800 later that afternoon, and she was transitioned to PO diltiazem at  60mg  q6h. Initial trop was 0.83 with downward trend to 0.40, and given she was unable to proceed with her stress test, along with multiple risk factors, the decision was made to proceed with a LHC. She underwent cath with Dr. Burt Knack on 02/09/2016 which showed normal coronaries. Also had a 2D echo that showed EF 60-65% with NWMA. After cath she was started on Xarelto given her CHA2DS2-VASc Score of 3.   Her elevated troponin levels were thought to be likely related to demand ischemia and the setting of rapid HR. Her blood sugars remained elevated during admission, but she has been on steroids in both the in and outpatient setting. She was treated with 35units of Lantus inpatient, but reports she normally increases dose at home to account for steroid use.   On 02/10/2016 she was seen and examined by Dr. Martinique and determined stable for discharge home. Given her cath and echo were unremarkable, she is considered low risk for surgical intervention. She can reschedule for temporal artery biopsy at this time. Instructed her that once biopsy is scheduled she will need to hold Xarelto 48 hours prior to procedure. I have arranged for follow up in the office in 2 weeks. I have also instructed her of the need to follow up with her PCP on her blood sugars and HgB A1c.  _____________  Discharge Vitals Blood pressure 134/73, pulse 77, temperature 97.7 F (36.5 C), temperature source Oral, resp. rate 20, height 5\' 2"  (1.575 m), weight 278 lb 9.6 oz (126.372 kg), SpO2 100 %.  Filed Weights   02/09/16 0351 02/09/16 1129 02/10/16 0516  Weight: 276 lb 14.4 oz (125.601 kg) 276 lb 14.4 oz (125.6 kg) 278 lb 9.6 oz (126.372 kg)    Labs & Radiologic Studies    CBC  Recent Labs  02/08/16 1249 02/09/16 0734  WBC 12.8* 15.5*  HGB 14.5 11.3*  HCT 45.2 35.7*  MCV 86.3 85.6  PLT 277 123456   Basic Metabolic Panel  Recent Labs  02/08/16 1249 02/09/16 0734  NA 138 136  K 4.5 4.2  CL 106 103  CO2 24 27  GLUCOSE  263* 247*  BUN 23* 20  CREATININE 1.01* 0.88  CALCIUM 9.8 9.1  MG 1.8  --    Liver Function Tests  Recent Labs  02/08/16 1257  AST 98*  ALT 113*  ALKPHOS 68  BILITOT 0.7  PROT 6.8  ALBUMIN 3.3*   No results for input(s): LIPASE, AMYLASE in the last 72 hours. Cardiac Enzymes  Recent Labs  02/09/16 1833 02/10/16 0306 02/10/16 0637  TROPONINI 0.46* 0.41* 0.40*   BNP Invalid input(s): POCBNP D-Dimer No results for input(s): DDIMER in the last 72 hours. Hemoglobin A1C No results for input(s): HGBA1C in the last 72 hours. Fasting Lipid Panel  Recent Labs  02/09/16 0734  CHOL 172  HDL 72  LDLCALC 81  TRIG 94  CHOLHDL 2.4   Thyroid Function Tests  Recent Labs  02/08/16 1256  TSH 0.753   _____________  Dg Chest Portable 1 View  02/08/2016  CLINICAL DATA:  Atrial fibrillation.  Tachycardia.  Weakness. EXAM: PORTABLE CHEST 1 VIEW COMPARISON:  09/28/2005 FINDINGS: Heart size within normal limits for  the AP projection. The lungs appear clear. Mediastinum unremarkable. IMPRESSION: 1.  No significant abnormality identified. Electronically Signed   By: Van Clines M.D.   On: 02/08/2016 13:10   Disposition   Pt is being discharged home today in good condition.  Follow-up Plans & Appointments    Follow-up Information    Follow up with Baldwin Jamaica, PA-C On 02/22/2016.   Specialty:  Cardiology   Why:  1:30pm for your hospital follow up   Contact information:   [place] [address] [phone]       Please follow up.   Why:  Please follow up with your PCP for your diabetes and Hgb A1c.     Discharge Instructions    Diet - low sodium heart healthy    Complete by:  As directed      Discharge instructions    Complete by:  As directed   Start taking Metoprolol, Diltiazem, and Xarelto Stop taking lisinopril, Maxzide and ASA  Call your doctor to reschedule your temporal biopsy. Xarelto will need to be held 48 hours prior to  procedure.     Increase activity slowly    Complete by:  As directed            Discharge Medications   Current Discharge Medication List    START taking these medications   Details  diltiazem (CARDIZEM CD) 120 MG 24 hr capsule Take 1 capsule (120 mg total) by mouth daily.     metoprolol tartrate (LOPRESSOR) 25 MG tablet Take 1 tablet (25 mg total) by mouth 2 (two) times daily.     rivaroxaban (XARELTO) 20 MG TABS tablet Take 1 tablet (20 mg total) by mouth daily with supper.       CONTINUE these medications which have NOT CHANGED   Details  Cholecalciferol (VITAMIN D PO) Take 1 tablet by mouth daily.    DULoxetine (CYMBALTA) 60 MG capsule Take 60 mg by mouth daily.    esomeprazole (NEXIUM) 40 MG capsule Take 40 mg by mouth daily.    folic acid (FOLVITE) 1 MG tablet Take 1 mg by mouth daily.    glimepiride (AMARYL) 4 MG tablet Take 4 mg by mouth daily.     Insulin Glargine (BASAGLAR KWIKPEN) 100 UNIT/ML SOPN Inject 30-70 Units into the skin See admin instructions. Takes 30 units every night but if she takes prednisone she takes twice daily according to sliding scale    JANUVIA 100 MG tablet Take 100 mg by mouth daily.     pravastatin (PRAVACHOL) 40 MG tablet Take 40 mg by mouth daily.    predniSONE (DELTASONE) 20 MG tablet Take 20-40 mg by mouth 2 (two) times daily with a meal. Takes 40mg  in the morning and 20mg  at night      STOP taking these medications     aspirin 81 MG chewable tablet      CINNAMON PO      lisinopril (PRINIVIL,ZESTRIL) 40 MG tablet      triamterene-hydrochlorothiazide (MAXZIDE-25) 37.5-25 MG tablet           Outstanding Labs/Studies   None  Duration of Discharge Encounter   Greater than 30 minutes including physician time.  Signed, Reino Bellis NP-C 02/10/2016, 10:50 AM

## 2016-02-17 ENCOUNTER — Telehealth: Payer: Self-pay | Admitting: Cardiology

## 2016-02-17 NOTE — Telephone Encounter (Signed)
No answer. Left message that I did not see where anyone had called her from our office at this time but to please call us back if she has any question or concern.

## 2016-02-17 NOTE — Telephone Encounter (Signed)
New Message   pt stated she received a call this morning and was returning the call. If call, please return call to discuss

## 2016-02-22 ENCOUNTER — Ambulatory Visit (INDEPENDENT_AMBULATORY_CARE_PROVIDER_SITE_OTHER): Payer: 59 | Admitting: Physician Assistant

## 2016-02-22 ENCOUNTER — Encounter: Payer: Self-pay | Admitting: Physician Assistant

## 2016-02-22 VITALS — BP 164/70 | HR 84 | Ht 62.0 in | Wt 292.2 lb

## 2016-02-22 DIAGNOSIS — I480 Paroxysmal atrial fibrillation: Secondary | ICD-10-CM | POA: Diagnosis not present

## 2016-02-22 DIAGNOSIS — I10 Essential (primary) hypertension: Secondary | ICD-10-CM | POA: Diagnosis not present

## 2016-02-22 DIAGNOSIS — R609 Edema, unspecified: Secondary | ICD-10-CM | POA: Diagnosis not present

## 2016-02-22 DIAGNOSIS — L299 Pruritus, unspecified: Secondary | ICD-10-CM

## 2016-02-22 DIAGNOSIS — I5189 Other ill-defined heart diseases: Secondary | ICD-10-CM

## 2016-02-22 DIAGNOSIS — I519 Heart disease, unspecified: Secondary | ICD-10-CM

## 2016-02-22 MED ORDER — TRIAMTERENE-HCTZ 37.5-25 MG PO TABS: 1.0000 | ORAL_TABLET | Freq: Every day | ORAL | 3 refills | Status: DC

## 2016-02-22 MED ORDER — METOPROLOL SUCCINATE ER 50 MG PO TB24: 50.0000 mg | ORAL_TABLET | Freq: Every day | ORAL | 3 refills | Status: DC

## 2016-02-22 NOTE — Patient Instructions (Addendum)
Medication Instructions:   STOP TAKING DILTIAZEM   START TAKING METOPROLOL 50 MG ONCE A DAY     CONTINUE TAKING MAXZIDE  37.5/25 ONCE A DAY   If you need a refill on your cardiac medications before your next appointment, please call your pharmacy.  Labwork: NONE ORDER TODAY    Testing/Procedures: NONE ORDER TODAY    Follow-Up: IN 2 WEEKS WITH RENEE   Any Other Special Instructions Will Be Listed Below (If Applicable).

## 2016-02-22 NOTE — Progress Notes (Signed)
Cardiology Office Note Date:  02/22/2016  Patient ID:  Angela, [name] 10/12/1960, MRN [id number] PCP:  Cari Caraway, MD  Cardiologist:  Dr. Lovena Le   Chief Complaint: hospital f/u  History of Present Illness: Angela [name] is a 55 y.o. female with history of M, HTN, HLD, depression, obesity, GERD, venous stasis, temporal arteritis who was seen by Dr. Lovena Le on 02/07/2016 in the office for pre-operative evaluation and abnormal EKG with inferolateral TWI. She was is planning to undergo a temporal artery biopsy on 7/20. Dr. Lovena Le recommended Lexiscan nuclear stress test. She presented as an outpatient for this on 02/08/2016 - when hooked up to the monitor she was noted to be in afib RVR with HR 190s-200s, preserved BP. She reported feeling crummy that morning, overall fatigued particularly with exertion. She can't really pinpoint a time of when this started. She reports intermittent dyspnea which is chronic. She denies any chest pain, nausea, vomiting, orthopnea, syncope or awareness of elevated HR. She denies any history of stroke, ministroke, or any bleeding issues. Nuc med did not have the capacity to treat and monitor the patient down long-term so she was brought to the ER for further evaluation.   In the ED she was placed in a room and started on IV diltiazem, given 2 10mg  boluses which reduced her heart rate into the 120-130s. She was also started on metoprolol 25mg  BID, along with heparin. Telemetry showed that she converted to NSR at 1800 later that afternoon, and she was transitioned to PO diltiazem at 60mg  q6h. Initial trop was 0.83 with downward trend to 0.40, the decision was made to proceed with a LHC. She underwent cath with Dr. Burt Knack on 02/09/2016 which showed normal coronaries. Also had a 2D echo that showed EF 60-65% with NWMA. After cath she was started on Xarelto given her CHA2DS2-VASc Score of 3. She was discharged 02/10/16, cleared for her temporal artery bx with instructions to  hold the xarelto 48hours prior..   Since her hospital stay, she reports increased LE swelling and aching LE especially RLE since her diuretic was stopped, she states this is a problem for her with known venous stasis, she reports swelling and discoloration ans chronic, though more swollen then normal.  No CP, she has has only a few fleeting palpitations, mild DOE, no PND or orthopnea symptoms, no dizziness, near syncope or syncope.  She thinks one of the new medicines is making her skin itch, though has not noted any rash.  She has not had any bleeding or signs of bleeding on the [facility].  She is not yet scheduled for her biopsy.  Past Medical History:  Diagnosis Date  . Complication of anesthesia    had epidural for C-section and epidural did not take until after surgery  . Depression   . Diabetes mellitus without complication ([place])   . GERD (gastroesophageal reflux disease)   . Heart murmur   . Hyperlipidemia   . Hypertension   . Obesity   . Temporal arteritis [facility])     Past Surgical History:  Procedure Laterality Date  . BREAST SURGERY     bilateral breast reduction  . CARDIAC CATHETERIZATION N/A 02/09/2016   Procedure: Left Heart Cath and Coronary Angiography;  Surgeon: Sherren Mocha, MD;  Location: [facility];  Service: Cardiovascular;  Laterality: N/A;  . COLONOSCOPY    . TUBAL LIGATION      Current Outpatient Prescriptions  Medication Sig Dispense Refill  . Cholecalciferol (VITAMIN D PO)  Take 1 tablet by mouth daily.    . DULoxetine (CYMBALTA) 60 MG capsule Take 60 mg by mouth daily.    Marland Kitchen esomeprazole (NEXIUM) 40 MG capsule Take 40 mg by mouth daily.    . folic acid (FOLVITE) 1 MG tablet Take 1 mg by mouth daily.    Marland Kitchen glimepiride (AMARYL) 4 MG tablet Take 4 mg by mouth daily.     . Insulin Glargine (BASAGLAR KWIKPEN) 100 UNIT/ML SOPN Inject 30-70 Units into the skin See admin instructions. Takes 30 units every night but if she takes prednisone she takes twice daily  according to sliding scale    . JANUVIA 100 MG tablet Take 100 mg by mouth daily.  0  . metoprolol succinate (TOPROL-XL) 50 MG 24 hr tablet Take 1 tablet (50 mg total) by mouth daily. Take with or immediately following a meal. 30 tablet 3  . pravastatin (PRAVACHOL) 40 MG tablet Take 40 mg by mouth daily.    . predniSONE (DELTASONE) 20 MG tablet Take 20-40 mg by mouth 2 (two) times daily with a meal. Takes 40mg  in the morning and 20mg  at night    . rivaroxaban (XARELTO) 20 MG TABS tablet Take 1 tablet (20 mg total) by mouth daily with supper. 30 tablet 11  . triamterene-hydrochlorothiazide (MAXZIDE-25) 37.5-25 MG tablet Take 1 tablet by mouth daily. 30 tablet 3   No current facility-administered medications for this visit.     Allergies:   E-mycin [erythromycin]; Avocado; Banana; Glucophage [metformin hcl]; Kiwi extract; Mango flavor; Levemir [insulin detemir]; and Lexapro [escitalopram oxalate]   Social History:  The patient  reports that she has never smoked. She has never used smokeless tobacco. She reports that she drinks alcohol. She reports that she does not use drugs.   Family History:  The patient's family history includes Kidney failure in her father.  ROS:  Please see the history of present illness.   All other systems are reviewed and otherwise negative.   PHYSICAL EXAM:  VS:  BP (!) 164/70   Pulse 84   Ht 5\' 2"  (1.575 m)   Wt 292 lb 3.2 oz (132.5 kg)   SpO2 98%   BMI 53.44 kg/m  BMI: Body mass index is 53.44 kg/m. Well nourished, obese, well developed, in no acute distress  HEENT: normocephalic, atraumatic  Neck: no JVD, carotid bruits or masses Cardiac:  RRR; no significant murmurs, no rubs, or gallops Lungs:  clear to auscultation bilaterally, no wheezing, rhonchi or rales  Abd: soft, nontender MS: no deformity or atrophy Ext: chronic looking skin changes b/l, significant LE edema, the patient reports at baseline she has venous stasis and is always swollen though now  more then usual. Skin: warm and dry, no rash Neuro:  No gross deficits appreciated Psych: euthymic mood, full affect   EKG:  02/07/16: SR, inf/lat T changes  02/09/16: Echocardiogram Study Conclusions - Left ventricle: The cavity size was normal. Wall thickness was  increased in a pattern of moderate LVH. Systolic function was  normal. The estimated ejection fraction was in the range of 60%  to 65%. Wall motion was normal; there were no regional wall  motion abnormalities. Doppler parameters are consistent with  abnormal left ventricular relaxation (grade 1 diastolic  dysfunction). The E/e&' ratio is >15, suggesting elevated LV  filling pressure. - Aortic valve: Trileaflet. Sclerosis without stenosis. There was  no regurgitation. - Mitral valve: Calcified annulus. Mildly thickened leaflets .  There was trivial regurgitation. - Left atrium: The  atrium was mildly dilated. - Tricuspid valve: There was trivial regurgitation. - Pulmonary arteries: PA peak pressure: 23 mm Hg (S). - Inferior vena cava: The vessel was normal in size. The  respirophasic diameter changes were in the normal range (>= 50%),  consistent with normal central venous pressure. Impressions: - LVEF 60-65%, moderate LVH, normal wall motion, diastolic  dysfunction with elevated LV filling pressure, trivial MR, mildly  dilated LA, trivial TR, RVSP 23 mmHg, normal IVC.  LHC: 02/09/2016    Conclusion    1. Angiographically normal coronary arteries 2. Elevated LVEDP  Recommend: Suspect demand ischemia from AFib with RVR. Start Xarelto tonight.     Recent Labs: 02/08/2016: ALT 113; Magnesium 1.8; TSH 0.753 02/09/2016: BUN 20; Creatinine, Ser 0.88; Hemoglobin 11.3; Platelets 273; Potassium 4.2; Sodium 136  02/09/2016: Cholesterol 172; HDL 72; LDL Cholesterol 81; Total CHOL/HDL Ratio 2.4; Triglycerides 94; VLDL 19   Estimated Creatinine Clearance: 93.6 mL/min (by C-G formula based on SCr of 0.88  mg/dL).   Wt Readings from Last 3 Encounters:  02/22/16 292 lb 3.2 oz (132.5 kg)  02/10/16 278 lb 9.6 oz (126.4 kg)  02/07/16 282 lb 6.4 oz (128.1 kg)     Other studies reviewed: Additional studies/records reviewed today include: summarized above  ASSESSMENT AND PLAN:  1. New PAfib     CHA2DS2Vasc is at least 3, on Xarelto     Calc cr. Cl with her last lab/weight is 144.14  2. HTN     Does not appear well controlled  3. Venous stasis with worsening edema and LE aching off her Maxide     Diltiazem possibly contributes  4. Itching, no rash     3 new medicines, diltiazem, xarelto, and metoprolol  5. Diastolic dysfunction     Discussed importance of HTN control    Will stop the diltiazem ?itching, and edema, increase her metoprolol to 50mg  daily, and resume her Maxide 37.5/25mg  daily, given her BMET looked OK.   Disposition: Will see her back in 2 weeks sooner if needed to f/u on her edema, itching, BP.  Current medicines are reviewed at length with the patient today.  The patient did not have any concerns regarding medicines.  Haywood Lasso, PA-C 02/22/2016 6:44 PM     [place]  [address] [phone] (office)  [phone] (fax)

## 2016-02-28 ENCOUNTER — Telehealth: Payer: Self-pay | Admitting: Cardiology

## 2016-02-28 NOTE — Telephone Encounter (Signed)
New message  Angela [name] from [facility] call requesting to speak with RN  Pt c/o medication issue:  1. Name of Medication: pt states she receieved three meds from Dr. Lovena Le. Xarelto, pt states she does not remember the names of the other meds; just that one states with a M and the other with a D  2. How are you currently taking this medication (dosage and times per day)? Pt states she does not know.  3. Are you having a reaction (difficulty breathing--STAT)? Pt states she just feels really bad. Pt states its hard to describe.   4. What is your medication issue? Pt states she is going to stop taking the meds because she does not know which one is causing the reaction. Please call back to discuss

## 2016-02-28 NOTE — Telephone Encounter (Signed)
Returned call to patient.She stated she continues to itch all over and having pain in both legs.Stated she saw Tommye Standard PA 02/22/16 and diltiazem was stopped.Stated since then she has stopped xarelto and metoprolol.Advised patient she needs to restart taking xarelto and metoprolol.Advised she can take benadryl as needed.Spoke to [facility] he advised she needs to be seen and she needs to take xarelto and metoprolol.Message left with Dr.Taylor's scheduler needs appointment this week.

## 2016-03-02 ENCOUNTER — Encounter: Payer: Self-pay | Admitting: Nurse Practitioner

## 2016-03-02 NOTE — Telephone Encounter (Signed)
Follow up       Pt is asking Dr Lovena Le if she can cancel her appt tomorrow and come in later in august with Renee.  She states she stopped taking the medication that was making her itch and she feels fine.  She states she can not come tomorrow anyway and wishes to cancel appt.  Please call

## 2016-03-02 NOTE — Telephone Encounter (Signed)
Spoke with patient who is reporting she has stopped all her medication including Diltiazem, Metoprolol and Xarelto.  She is taking Lisinopril, ASA, Prednisone and her DM medications.  Advised pt she is putting herself at an increased risk of having a stroke.  She states she was having a feeling of throat closing up, itching and had started falling and could hardly walk around.  She felt she had to stop them.  Advised she should at less restart Xarelto but she doesn't want to do this.  She is also stating she can not come in for her appt tomorrow but can come in to see Renee as scheduled 8/21.  I removed her from the appt schedule for tomorrow.

## 2016-03-03 ENCOUNTER — Ambulatory Visit: Payer: 59 | Admitting: Nurse Practitioner

## 2016-03-12 NOTE — Progress Notes (Addendum)
Cardiology Office Note Date:  03/13/2016  Patient ID:  Angela [name], Angela [name] May 03, 1961, MRN [id number] PCP:  Cari Caraway, MD  Cardiologist:  Dr. Lovena Le   Chief Complaint: hospital f/u  History of Present Illness: Angela [name] is a 55 y.o. female with history of M, HTN, HLD, DM, depression, obesity, GERD, venous stasis, temporal arteritis who was seen by Dr. Lovena Le on 02/07/2016 in the office for pre-operative evaluation and abnormal EKG with inferolateral TWI. She was is planning to undergo a temporal artery biopsy on 7/20. Dr. Lovena Le recommended Lexiscan nuclear stress test. She presented as an outpatient for this on 02/08/2016 - when hooked up to the monitor she was noted to be in afib RVR with HR 190s-200s, preserved BP. She reported feeling crummy that morning, overall fatigued particularly with exertion. She can't really pinpoint a time of when this started. She reports intermittent dyspnea which is chronic. She denies any chest pain, nausea, vomiting, orthopnea, syncope or awareness of elevated HR. She denies any history of stroke, ministroke, or any bleeding issues. Nuc med did not have the capacity to treat and monitor the patient down long-term so she was brought to the ER for further evaluation.   In the ED she was placed in a room and started on IV diltiazem, given 2 10mg  boluses which reduced her heart rate into the 120-130s. She was also started on metoprolol 25mg  BID, along with heparin. Telemetry showed that she converted to NSR at 1800 later that afternoon, and she was transitioned to PO diltiazem at 60mg  q6h. Initial trop was 0.83 with downward trend to 0.40, the decision was made to proceed with a LHC. She underwent cath with Dr. Burt Knack on 02/09/2016 which showed normal coronaries. Also had a 2D echo that showed EF 60-65% with NWMA. After cath she was started on Xarelto given her CHA2DS2-VASc Score of 3. She was discharged 02/10/16, cleared for her temporal artery bx with instructions  to hold the xarelto 48hours prior..   She was seen by myself 02/22/16 with reports increased LE swelling and aching LE especially RLE since her diuretic was stopped, she states this is a problem for her with known venous stasis, she reports swelling and discoloration ans chronic, though more swollen then normal.  Her diltiazem was stopped, her BB up-titrated and her Maxide resumed.  She comes in today for f/o on the se changes.   She reports stopping the ditiazem did not make any difference in her itching, in-fact seemed to steadily increase to the point that she could not tolearate it, feels like it made her legs ace, feel weak, thought should would need a cane, and once had a fall (with no trauma), and stopped the metoprolol and xarelto with complete resolution of her symptoms,  Reports off both a full 2 weeks.  She has identified a trigger for her palpitations, drinking ice cold drinks.  She is back on her maxide and reports her LE edema back to her baseline.    No CP, no SOB, no PND or orthopnea symptoms, no dizziness, near syncope or syncope. She is not yet scheduled for her biopsy.  She remains on prednisone for that, and reports the biopsy is on hold indefinitely at least for now, though mentions when they try to wean of she starts to get symptoms again.    Past Medical History:  Diagnosis Date  . Complication of anesthesia    had epidural for C-section and epidural did not take until after surgery  .  Depression   . Diabetes mellitus without complication ([place])   . GERD (gastroesophageal reflux disease)   . Heart murmur   . Hyperlipidemia   . Hypertension   . Obesity   . Temporal arteritis [facility])     Past Surgical History:  Procedure Laterality Date  . BREAST SURGERY     bilateral breast reduction  . CARDIAC CATHETERIZATION N/A 02/09/2016   Procedure: Left Heart Cath and Coronary Angiography;  Surgeon: Sherren Mocha, MD;  Location: [place];  Service: Cardiovascular;   Laterality: N/A;  . COLONOSCOPY    . TUBAL LIGATION      Current Outpatient Prescriptions  Medication Sig Dispense Refill  . Cholecalciferol (VITAMIN D PO) Take 1 tablet by mouth daily.    . DULoxetine (CYMBALTA) 60 MG capsule Take 60 mg by mouth daily.    Marland Kitchen esomeprazole (NEXIUM) 40 MG capsule Take 40 mg by mouth daily.    . folic acid (FOLVITE) 1 MG tablet Take 1 mg by mouth daily.    Marland Kitchen glimepiride (AMARYL) 4 MG tablet Take 4 mg by mouth daily.     . Insulin Glargine (BASAGLAR KWIKPEN) 100 UNIT/ML SOPN Inject 30-70 Units into the skin See admin instructions. Takes 30 units every night but if she takes prednisone she takes twice daily according to sliding scale    . JANUVIA 100 MG tablet Take 100 mg by mouth daily.  0  . pravastatin (PRAVACHOL) 40 MG tablet Take 40 mg by mouth daily.    . predniSONE (DELTASONE) 20 MG tablet Take 20-40 mg by mouth 2 (two) times daily with a meal. Takes 40mg  in the morning and 20mg  at night    . triamterene-hydrochlorothiazide (MAXZIDE-25) 37.5-25 MG tablet Take 1 tablet by mouth daily. 30 tablet 3  . apixaban (ELIQUIS) 5 MG TABS tablet Take 1 tablet (5 mg total) by mouth 2 (two) times daily. 60 tablet 5   No current facility-administered medications for this visit.     Allergies:   E-mycin [erythromycin]; Avocado; Banana; Glucophage [metformin hcl]; Kiwi extract; Mango flavor; Levemir [insulin detemir]; and Lexapro [escitalopram oxalate]   Social History:  The patient  reports that she has never smoked. She has never used smokeless tobacco. She reports that she drinks alcohol. She reports that she does not use drugs.   Family History:  The patient's family history includes Kidney failure in her father.  ROS:  Please see the history of present illness.   All other systems are reviewed and otherwise negative.   PHYSICAL EXAM:   VS:  BP 132/74   Pulse 95   Ht 5\' 2"  (1.575 m)   Wt 285 lb (129.3 kg)   BMI 52.13 kg/m  BMI: Body mass index is 52.13  kg/m. Well nourished, obese, well developed, in no acute distress  HEENT: normocephalic, atraumatic  Neck: no JVD, carotid bruits or masses Cardiac:  RRR; no significant murmurs, extrasystoles noted, no rubs, or gallops Lungs:  clear to auscultation bilaterally, no wheezing, rhonchi or rales  Abd: soft, nontender MS: no deformity or atrophy Ext: chronic looking skin changes b/l,  LE edema, per the patient at her baseline Skin: warm and dry, no rash Neuro:  No gross deficits appreciated Psych: euthymic mood, full affect   EKG:  Done today and reviewed by myself is SR, APCs, nonspecific T changes  02/09/16: Echocardiogram Study Conclusions - Left ventricle: The cavity size was normal. Wall thickness was  increased in a pattern of moderate LVH. Systolic function  was  normal. The estimated ejection fraction was in the range of 60%  to 65%. Wall motion was normal; there were no regional wall  motion abnormalities. Doppler parameters are consistent with  abnormal left ventricular relaxation (grade 1 diastolic  dysfunction). The E/e&' ratio is >15, suggesting elevated LV  filling pressure. - Aortic valve: Trileaflet. Sclerosis without stenosis. There was  no regurgitation. - Mitral valve: Calcified annulus. Mildly thickened leaflets .  There was trivial regurgitation. - Left atrium: The atrium was mildly dilated. - Tricuspid valve: There was trivial regurgitation. - Pulmonary arteries: PA peak pressure: 23 mm Hg (S). - Inferior vena cava: The vessel was normal in size. The  respirophasic diameter changes were in the normal range (>= 50%),  consistent with normal central venous pressure. Impressions: - LVEF 60-65%, moderate LVH, normal wall motion, diastolic  dysfunction with elevated LV filling pressure, trivial MR, mildly  dilated LA, trivial TR, RVSP 23 mmHg, normal IVC.  LHC: 02/09/2016    Conclusion    1. Angiographically normal coronary arteries 2.  Elevated LVEDP  Recommend: Suspect demand ischemia from AFib with RVR. Start Xarelto tonight.     Recent Labs: 02/08/2016: ALT 113; Magnesium 1.8; TSH 0.753 02/09/2016: BUN 20; Creatinine, Ser 0.88; Hemoglobin 11.3; Platelets 273; Potassium 4.2; Sodium 136  02/09/2016: Cholesterol 172; HDL 72; LDL Cholesterol 81; Total CHOL/HDL Ratio 2.4; Triglycerides 94; VLDL 19   CrCl cannot be calculated (Patient's most recent lab result is older than the maximum 21 days allowed.).   Wt Readings from Last 3 Encounters:  03/13/16 285 lb (129.3 kg)  02/22/16 292 lb 3.2 oz (132.5 kg)  02/10/16 278 lb 9.6 oz (126.4 kg)     Other studies reviewed: Additional studies/records reviewed today include: summarized above  ASSESSMENT AND PLAN:  1. New PAfib     CHA2DS2Vasc is at least 3, self stopped the Xarelto secondary to itching (resolved)     Calc cr. Cl with her last lab/weight is 144.14  2. HTN     Better today  3. Venous stasis w    At her baseline today  4. Itching, no rash     No clear improvement when dilt was stopped, she shortly afterwards self stopped the metoprolol and xarelto at the same time, has full resolution  5. Diastolic dysfunction     Weight stable (down 7lbs back on maxide)  Lengthy discussion today, she is very reluctant to try any new medicines, talked about stroke/embolic risk with her AFib, she is agreeable to try a different Stem.  Will make one change at a time, Will start Eliquis 5mg  BID, check BMET back on her maxide, suspect of sleep apnea, will have her scheduled for a sleep study given heavy snoring and a sleepiness scale score of 17 by the screening done today.  Should she have any itching or symptoms with the Eliquis instructed to stop and notify us or seek attention if needed.  She is counseled to avoid stimulants and known triggers for her palpitations/AFib.   Disposition: 1 month office visit, sooner if needed.  Current medicines are reviewed at length with  the patient today.  The patient did not have any concerns regarding medicines.  Haywood Lasso, PA-C 03/13/2016 3:05 PM     [place] [address] [phone] (office)  [phone] (fax)

## 2016-03-13 ENCOUNTER — Encounter (INDEPENDENT_AMBULATORY_CARE_PROVIDER_SITE_OTHER): Payer: Self-pay

## 2016-03-13 ENCOUNTER — Ambulatory Visit (INDEPENDENT_AMBULATORY_CARE_PROVIDER_SITE_OTHER): Payer: 59 | Admitting: Physician Assistant

## 2016-03-13 VITALS — BP 132/74 | HR 95 | Ht 62.0 in | Wt 285.0 lb

## 2016-03-13 DIAGNOSIS — I10 Essential (primary) hypertension: Secondary | ICD-10-CM | POA: Diagnosis not present

## 2016-03-13 DIAGNOSIS — I480 Paroxysmal atrial fibrillation: Secondary | ICD-10-CM | POA: Diagnosis not present

## 2016-03-13 DIAGNOSIS — Z79899 Other long term (current) drug therapy: Secondary | ICD-10-CM

## 2016-03-13 LAB — BASIC METABOLIC PANEL
BUN: 27 mg/dL — ABNORMAL HIGH (ref 7–25)
CO2: 28 mmol/L (ref 20–31)
Calcium: 9.8 mg/dL (ref 8.6–10.4)
Chloride: 100 mmol/L (ref 98–110)
Creat: 1.1 mg/dL — ABNORMAL HIGH (ref 0.50–1.05)
GLUCOSE: 379 mg/dL — AB (ref 65–99)
POTASSIUM: 4.5 mmol/L (ref 3.5–5.3)
SODIUM: 140 mmol/L (ref 135–146)

## 2016-03-13 MED ORDER — APIXABAN 5 MG PO TABS: 5.0000 mg | ORAL_TABLET | Freq: Two times a day (BID) | ORAL | 5 refills | Status: AC

## 2016-03-13 NOTE — Patient Instructions (Addendum)
Medication Instructions:   START TAKING ELIQUIS 5 MG TWICE A DAY   If you need a refill on your cardiac medications before your next appointment, please call your pharmacy.  Labwork: BMET TODAY    Testing/Procedures: Your physician has recommended that you have a sleep study. This test records several body functions during sleep, including: brain activity, eye movement, oxygen and carbon dioxide blood levels, heart rate and rhythm, breathing rate and rhythm, the flow of air through your mouth and nose, snoring, body muscle movements, and chest and belly movement.  [facility] WILL CONTACT YOU FOR FURTHER STEPS FOR SLEEP STUDY  Follow-Up: IN ONE MONTH WITH RENEE URSUY   Any Other Special Instructions Will Be Listed Below (If Applicable).

## 2016-03-14 ENCOUNTER — Telehealth: Payer: Self-pay | Admitting: *Deleted

## 2016-03-14 NOTE — Telephone Encounter (Signed)
LMVOM TO CALL BACK OFFICE FOR RESULTS

## 2016-03-14 NOTE — Telephone Encounter (Signed)
-----   Message from [place], [place] sent at 03/14/2016  6:31 AM EDT ----- Please let the patient know her kidney function, potassium look OK, her kidney function slightly lower back on her diuretic, will need recheck at her next visit to monitor.  Her non-fasting BS was quite high, she needs to follow up with her PMD for DM management, and remind her to keep healthy eating habits.  Thanks [employer]

## 2016-03-15 ENCOUNTER — Telehealth: Payer: Self-pay | Admitting: *Deleted

## 2016-03-15 NOTE — Telephone Encounter (Signed)
SPOKE TO PT ABOUT RESULTS  ALSO  FOLLOW UP WITH PMD  FOR DIABETES MGTAND VERBALIZE UNDERSTANDING A LSO RE VERIFIED APPT DATE AND TIME  04/13/2016 WITH URSUY

## 2016-03-15 NOTE — Telephone Encounter (Signed)
-----   Message from [place], [place] sent at 03/14/2016  6:31 AM EDT ----- Please let the patient know her kidney function, potassium look OK, her kidney function slightly lower back on her diuretic, will need recheck at her next visit to monitor.  Her non-fasting BS was quite high, she needs to follow up with her PMD for DM management, and remind her to keep healthy eating habits.  Thanks [employer]

## 2016-03-20 ENCOUNTER — Encounter (HOSPITAL_COMMUNITY): Payer: Self-pay

## 2016-03-24 ENCOUNTER — Telehealth: Payer: Self-pay | Admitting: *Deleted

## 2016-03-24 NOTE — Telephone Encounter (Signed)
Prior authorization started for sleep study - Case is pending approval with [employer].   Case # [id number]

## 2016-03-31 NOTE — Telephone Encounter (Signed)
Clinical notes being faxed per [facility] request

## 2016-04-12 NOTE — Progress Notes (Signed)
Cardiology Office Note Date:  04/13/2016  Patient ID:  Angela [name], Angela [name] 12-Jan-1961, MRN [id number] PCP:  Cari Caraway, MD  Cardiologist:  Dr. Lovena Le   Chief Complaint:  Planned f/u on [facility] change  History of Present Illness: Angela [name] is a 55 y.o. female with history of M, HTN, HLD, DM, depression, obesity, GERD, venous stasis, temporal arteritis who was seen by Dr. Lovena Le on 02/07/2016 in the office for pre-operative evaluation and abnormal EKG with inferolateral TWI. She was is planning to undergo a temporal artery biopsy on 7/20. Dr. Lovena Le recommended Lexiscan nuclear stress test. She presented as an outpatient for this on 02/08/2016 - when hooked up to the monitor she was noted to be in afib RVR with HR 190s-200s, preserved BP. She reported feeling crummy that morning, overall fatigued particularly with exertion. She can't really pinpoint a time of when this started. She reports intermittent dyspnea which is chronic. She denies any chest pain, nausea, vomiting, orthopnea, syncope or awareness of elevated HR. She denies any history of stroke, ministroke, or any bleeding issues. Nuc med did not have the capacity to treat and monitor the patient down long-term so she was brought to the ER for further evaluation.   In the ED she was placed in a room and started on IV diltiazem, given 2 10mg  boluses which reduced her heart rate into the 120-130s. She was also started on metoprolol 25mg  BID, along with heparin. Telemetry showed that she converted to NSR at 1800 later that afternoon, and she was transitioned to PO diltiazem at 60mg  q6h. Initial trop was 0.83 with downward trend to 0.40, the decision was made to proceed with a LHC. She underwent cath with Dr. Burt Knack on 02/09/2016 which showed normal coronaries. Also had a 2D echo that showed EF 60-65% with NWMA. After cath she was started on Xarelto given her CHA2DS2-VASc Score of 3. She was discharged 02/10/16, cleared for her temporal artery bx  with instructions to hold the xarelto 48hours prior..   She was seen by myself 02/22/16 with reports increased LE swelling and aching LE especially RLE since her diuretic was stopped, she states this is a problem for her with known venous stasis, she reports swelling and discoloration ans chronic, though more swollen then normal.  Her diltiazem was stopped, her BB up-titrated and her Maxide resumed.  03/13/16 visit she reported to me she stopped the diltiazem did not make any difference in her itching, in-fact seemed to steadily increase to the point that she could not tolearate it, feels like it made her legs ache, feel weak, thought should would need a cane, and once had a fall (with no trauma), and stopped the metoprolol and xarelto with complete resolution of her symptoms.  She was started on Eliquis for a/c and comes in today to be seen in f/u after this change.  She is doing well, no side effects or reactions with the Eliquis.  No bleeding or signs of bleeding.  No CP, no SOB, no PND or orthopnea symptoms, no dizziness, near syncope or syncope. She is not yet scheduled for her biopsy.  She remains on prednisone for that, and reports the biopsy is on hold indefinitely at least for now, though mentions when they try to wean of she starts to get symptoms again.    Past Medical History:  Diagnosis Date  . Complication of anesthesia    had epidural for C-section and epidural did not take until after surgery  . Depression   .  Diabetes mellitus without complication ([place])   . GERD (gastroesophageal reflux disease)   . Heart murmur   . Hyperlipidemia   . Hypertension   . Obesity   . Temporal arteritis [facility])     Past Surgical History:  Procedure Laterality Date  . BREAST SURGERY     bilateral breast reduction  . CARDIAC CATHETERIZATION N/A 02/09/2016   Procedure: Left Heart Cath and Coronary Angiography;  Surgeon: Sherren Mocha, MD;  Location: [place];  Service: Cardiovascular;   Laterality: N/A;  . COLONOSCOPY    . TUBAL LIGATION      Current Outpatient Prescriptions  Medication Sig Dispense Refill  . apixaban (ELIQUIS) 5 MG TABS tablet Take 1 tablet (5 mg total) by mouth 2 (two) times daily. 60 tablet 5  . Cholecalciferol (VITAMIN D PO) Take 1 tablet by mouth daily.    . DULoxetine (CYMBALTA) 60 MG capsule Take 60 mg by mouth daily.    Marland Kitchen esomeprazole (NEXIUM) 40 MG capsule Take 40 mg by mouth daily.    . folic acid (FOLVITE) 1 MG tablet Take 1 mg by mouth daily.    Marland Kitchen glimepiride (AMARYL) 4 MG tablet Take 4 mg by mouth daily.     . Insulin Glargine (BASAGLAR KWIKPEN) 100 UNIT/ML SOPN Inject 30-70 Units into the skin See admin instructions. Takes 30 units every night but if she takes prednisone she takes twice daily according to sliding scale    . JANUVIA 100 MG tablet Take 100 mg by mouth daily.  0  . lisinopril (PRINIVIL,ZESTRIL) 40 MG tablet Take 40 mg by mouth daily.  3  . metFORMIN (GLUCOPHAGE-XR) 500 MG 24 hr tablet Take 500 mg by mouth daily.  3  . pravastatin (PRAVACHOL) 40 MG tablet Take 40 mg by mouth daily.    . predniSONE (DELTASONE) 20 MG tablet Take 20-40 mg by mouth 2 (two) times daily with a meal. Takes 40mg  in the morning and 20mg  at night    . triamterene-hydrochlorothiazide (MAXZIDE-25) 37.5-25 MG tablet Take 1 tablet by mouth daily. 30 tablet 3   No current facility-administered medications for this visit.     Allergies:   E-mycin [erythromycin]; Avocado; Banana; Glucophage [metformin hcl]; Kiwi extract; Mango flavor; Metoprolol; Xarelto [rivaroxaban]; Levemir [insulin detemir]; and Lexapro [escitalopram oxalate]   Social History:  The patient  reports that she has never smoked. She has never used smokeless tobacco. She reports that she drinks alcohol. She reports that she does not use drugs.   Family History:  The patient's family history includes Kidney failure in her father.  ROS:  Please see the history of present illness.   All other  systems are reviewed and otherwise negative   PHYSICAL EXAM:   VS:  BP (!) 160/80   Pulse 98   Ht 5\' 2"  (1.575 m)   Wt 290 lb (131.5 kg)   BMI 53.04 kg/m  BMI: Body mass index is 53.04 kg/m. Well nourished, obese, well developed, in no acute distress  HEENT: normocephalic, atraumatic  Neck: no JVD, carotid bruits or masses Cardiac:  RRR; no significant murmurs,  no rubs, or gallops Lungs:  clear to auscultation bilaterally, no wheezing, rhonchi or rales  Abd: soft, nontender MS: no deformity or atrophy Ext: chronic looking skin changes b/l,  LE edema, per the patient at her baseline Skin: warm and dry, no rash Neuro:  No gross deficits appreciated Psych: euthymic mood, full affect   EKG:  Done 03/13/16 SR, APCs, nonspecific T changes  02/09/16: Echocardiogram Study Conclusions - Left ventricle: The cavity size was normal. Wall thickness was  increased in a pattern of moderate LVH. Systolic function was  normal. The estimated ejection fraction was in the range of 60%  to 65%. Wall motion was normal; there were no regional wall  motion abnormalities. Doppler parameters are consistent with  abnormal left ventricular relaxation (grade 1 diastolic  dysfunction). The E/e&' ratio is >15, suggesting elevated LV  filling pressure. - Aortic valve: Trileaflet. Sclerosis without stenosis. There was  no regurgitation. - Mitral valve: Calcified annulus. Mildly thickened leaflets .  There was trivial regurgitation. - Left atrium: The atrium was mildly dilated. - Tricuspid valve: There was trivial regurgitation. - Pulmonary arteries: PA peak pressure: 23 mm Hg (S). - Inferior vena cava: The vessel was normal in size. The  respirophasic diameter changes were in the normal range (>= 50%),  consistent with normal central venous pressure. Impressions: - LVEF 60-65%, moderate LVH, normal wall motion, diastolic  dysfunction with elevated LV filling pressure, trivial MR,  mildly  dilated LA, trivial TR, RVSP 23 mmHg, normal IVC.  LHC: 02/09/2016    Conclusion    1. Angiographically normal coronary arteries 2. Elevated LVEDP  Recommend: Suspect demand ischemia from AFib with RVR. Start Xarelto tonight.     Recent Labs: 02/08/2016: ALT 113; Magnesium 1.8; TSH 0.753 02/09/2016: Hemoglobin 11.3; Platelets 273 03/13/2016: BUN 27; Creat 1.10; Potassium 4.5; Sodium 140  02/09/2016: Cholesterol 172; HDL 72; LDL Cholesterol 81; Total CHOL/HDL Ratio 2.4; Triglycerides 94; VLDL 19   CrCl cannot be calculated (Patient's most recent lab result is older than the maximum 21 days allowed.).   Wt Readings from Last 3 Encounters:  04/13/16 290 lb (131.5 kg)  03/13/16 285 lb (129.3 kg)  02/22/16 292 lb 3.2 oz (132.5 kg)     Other studies reviewed: Additional studies/records reviewed today include: summarized above  ASSESSMENT AND PLAN:  1. PAfib     CHA2DS2Vasc is at least 3, self stopped the Xarelto secondary to itching (resolved) >> Eliquis     03/13/16 Creat 1.10  2. HTN     A recheck is 140/80, she is encouraged to lose weight, exercise, and asked to monitor this.  She states today unusually hgh with an issue at work prior to coming in.  3. Venous stasis with edema/chronic skin changes    At her baseline per the patient  4. Itching, no rash     remains resolved  5. Diastolic dysfunction      Weight stable   Disposition: BMET today back on her diuretic, f/u in 6 months with Dr. Lovena Le, sooner if needed.  Current medicines are reviewed at length with the patient today.  The patient did not have any concerns regarding medicines.  Haywood Lasso, PA-C 04/13/2016 4:41 PM     [facility] [address] [place] [address] [phone] (office)  [phone] (fax)

## 2016-04-13 ENCOUNTER — Ambulatory Visit (INDEPENDENT_AMBULATORY_CARE_PROVIDER_SITE_OTHER): Payer: 59 | Admitting: Physician Assistant

## 2016-04-13 VITALS — BP 160/80 | HR 98 | Ht 62.0 in | Wt 290.0 lb

## 2016-04-13 DIAGNOSIS — I10 Essential (primary) hypertension: Secondary | ICD-10-CM | POA: Diagnosis not present

## 2016-04-13 DIAGNOSIS — Z5181 Encounter for therapeutic drug level monitoring: Secondary | ICD-10-CM

## 2016-04-13 DIAGNOSIS — I5032 Chronic diastolic (congestive) heart failure: Secondary | ICD-10-CM

## 2016-04-13 DIAGNOSIS — I480 Paroxysmal atrial fibrillation: Secondary | ICD-10-CM

## 2016-04-13 LAB — BASIC METABOLIC PANEL
BUN: 15 mg/dL (ref 7–25)
CALCIUM: 9.5 mg/dL (ref 8.6–10.4)
CO2: 26 mmol/L (ref 20–31)
Chloride: 102 mmol/L (ref 98–110)
Creat: 1.01 mg/dL (ref 0.50–1.05)
GLUCOSE: 282 mg/dL — AB (ref 65–99)
POTASSIUM: 4 mmol/L (ref 3.5–5.3)
SODIUM: 141 mmol/L (ref 135–146)

## 2016-04-13 NOTE — Patient Instructions (Signed)
Medication Instructions:   Your physician recommends that you continue on your current medications as directed. Please refer to the Current Medication list given to you today.    If you need a refill on your cardiac medications before your next appointment, please call your pharmacy.  Labwork: BMET    Testing/Procedures: NONE ORDER TODAY    Follow-Up: Your physician wants you to follow-up in:  IN 6   MONTHS WITH DR Knox Saliva will receive a reminder letter in the mail two months in advance. If you don't receive a letter, please call our office to schedule the follow-up appointment.  '   Any Other Special Instructions Will Be Listed Below (If Applicable).

## 2016-04-18 ENCOUNTER — Telehealth: Payer: Self-pay | Admitting: *Deleted

## 2016-04-18 NOTE — Telephone Encounter (Signed)
LMOVM TO CALL BACK  

## 2016-04-18 NOTE — Telephone Encounter (Signed)
-----   Message from [facility], [place] sent at 04/14/2016  4:56 PM EDT ----- Please let the patient know her potassium, kidney function are stable, no changes.  Thanks [employer]

## 2016-04-20 ENCOUNTER — Telehealth: Payer: Self-pay | Admitting: *Deleted

## 2016-04-20 NOTE — Telephone Encounter (Signed)
-----   Message from [facility], [place] sent at 04/14/2016  4:56 PM EDT ----- Please let the patient know her potassium, kidney function are stable, no changes.  Thanks [employer]

## 2016-04-20 NOTE — Telephone Encounter (Signed)
SPOKE TO PT ABOUT RESULTS AND VERBALIZED UNDERSTANDING  

## 2016-04-21 ENCOUNTER — Other Ambulatory Visit: Payer: 59

## 2016-04-27 ENCOUNTER — Other Ambulatory Visit: Payer: Self-pay | Admitting: *Deleted

## 2016-04-27 DIAGNOSIS — G4733 Obstructive sleep apnea (adult) (pediatric): Secondary | ICD-10-CM

## 2016-06-13 ENCOUNTER — Ambulatory Visit (HOSPITAL_BASED_OUTPATIENT_CLINIC_OR_DEPARTMENT_OTHER): Payer: 59 | Attending: Physician Assistant | Admitting: Cardiology

## 2016-06-13 VITALS — Ht 62.0 in | Wt 288.0 lb

## 2016-06-13 DIAGNOSIS — Z79899 Other long term (current) drug therapy: Secondary | ICD-10-CM | POA: Diagnosis not present

## 2016-06-13 DIAGNOSIS — G4736 Sleep related hypoventilation in conditions classified elsewhere: Secondary | ICD-10-CM | POA: Diagnosis not present

## 2016-06-13 DIAGNOSIS — G4733 Obstructive sleep apnea (adult) (pediatric): Secondary | ICD-10-CM | POA: Diagnosis not present

## 2016-07-09 NOTE — Procedures (Signed)
Patient Name: Angela [name], Angela [name] Date: 06/13/2016 Gender: Female D.O.B: 26-Jan-1961 Age (years): 55 Referring Provider: Brien Few Charlcie Cradle PA-C Height (inches): 30 Interpreting Physician: Fransico Him MD, ABSM Weight (lbs): 288 RPSGT: Carolin Coy BMI: 37 MRN: [id number] Neck Size: 17.00  CLINICAL INFORMATION The patient is referred for a split night study with BPAP  MEDICATIONS Medications self-administered by patient taken the night of the study : [place] As per the AASM Manual for the Scoring of Sleep and Associated Events v2.3 (April 2016) with a hypopnea requiring 4% desaturations.  The channels recorded and monitored were frontal, central and occipital EEG, electrooculogram (EOG), submentalis EMG (chin), nasal and oral airflow, thoracic and abdominal wall motion, anterior tibialis EMG, snore microphone, electrocardiogram, and pulse oximetry. Bi-level positive airway pressure (BiPAP) was initiated when the patient met split night criteria and was titrated according to treat sleep-disordered breathing.  RESPIRATORY PARAMETERS Diagnostic Total AHI (/hr): 121.5   RDI (/hr):122.0  OA Index (/hr): 50.7  CA Index (/hr): 0.4 REM AHI (/hr): 80.9   NREM AHI (/hr):128.7  Supine AHI (/hr):109.1  Non-supine AHI (/hr): 122.48 Min O2 Sat (%):52.00  Mean O2 (%): 86.68  Time below 88% (min):68.2    Titration Optimal IPAP Pressure (cm): N/A Optimal EPAP Pressure (cm):N/A  AHI at Optimal Pressure (/hr): N/A Min O2 at Optimal Pressure (%):N/A Sleep % at Optimal (%):N/A  Supine % at Optimal (%):N/A     SLEEP ARCHITECTURE The study was initiated at 10:48:14 PM and terminated at 5:14:46 AM. The total recorded time was 386.5 minutes. EEG confirmed total sleep time was 335.5 minutes yielding a sleep efficiency of 86.8%. Sleep onset after lights out was 9.8 minutes with a REM latency of 39.0 minutes. The patient spent 10.73% of the night in stage N1 sleep,  60.80% in stage N2 sleep, 0.00% in stage N3 and 28.46% in REM. Wake after sleep onset (WASO) was 41.2 minutes. The Arousal Index was 34.0/hour.  LEG MOVEMENT DATA The total Periodic Limb Movements of Sleep (PLMS) were 0. The PLMS index was 0.00 .  CARDIAC DATA The 2 lead EKG demonstrated sinus rhythm. The mean heart rate was 87.52 beats per minute. Other EKG findings include: None.  IMPRESSIONS - Severe obstructive sleep apnea occurred during the diagnostic portion of the study (AHI = 121.5 /hour). An optimal PAP pressure for this patient could not be determined due to onging respiratory events on CPAP. - No significant central sleep apnea occurred during the diagnostic portion of the study (CAI = 0.4/hour). - Severe oxygen desaturation was noted during the diagnostic portion of the study (Min O2 = 52.00%). - The patient snored with Loud snoring volume during the diagnostic portion of the study. - No cardiac abnormalities were noted during this study. - Clinically significant periodic limb movements of sleep did not occur during the study.  DIAGNOSIS - Obstructive Sleep Apnea (327.23 [G47.33 ICD-10]) - Nocturnal Hypoxemia  RECOMMENDATIONS - As the patient could not be adequately titrated o CPAP due to ongoing respiratory events, recommend in lab BiPAP titration.  - Avoid alcohol, sedatives and other CNS depressants that may worsen sleep apnea and disrupt normal sleep architecture. - Sleep hygiene should be reviewed to assess factors that may improve sleep quality. - Weight management and regular exercise should be initiated or continued.  [place], American Board of Sleep Medicine  ELECTRONICALLY SIGNED ON:  07/09/2016, 12:04 AM [place]: [phone]   FX: [phone] ACCREDITED  BY THE AMERICAN ACADEMY OF SLEEP MEDICINE

## 2016-07-10 NOTE — Progress Notes (Signed)
Spoke to the patient gave her the results she questioned why she needed a bipap I explained she verbalized understanding

## 2016-07-11 ENCOUNTER — Other Ambulatory Visit: Payer: Self-pay | Admitting: *Deleted

## 2016-07-11 DIAGNOSIS — G4733 Obstructive sleep apnea (adult) (pediatric): Secondary | ICD-10-CM

## 2016-07-12 ENCOUNTER — Encounter: Payer: Self-pay | Admitting: *Deleted

## 2016-07-12 ENCOUNTER — Telehealth: Payer: Self-pay | Admitting: *Deleted

## 2016-07-12 NOTE — Telephone Encounter (Signed)
Spoke to the patient gave her the results and she wanted to know why Dr Radford Pax ordered a Bipap titration, I explained that her sleep apnea was too severe for a cpap so a Bipap was ordered

## 2016-07-12 NOTE — Telephone Encounter (Signed)
-----   Message from Sueanne Margarita, MD sent at 07/09/2016 12:10 AM EST ----- Please let patient know that they have significant sleep apnea and had unsuccessful CPAP titration.  Please set up BiPAP titration in the lab

## 2016-08-27 ENCOUNTER — Encounter (HOSPITAL_BASED_OUTPATIENT_CLINIC_OR_DEPARTMENT_OTHER): Payer: 59

## 2016-09-19 ENCOUNTER — Telehealth: Payer: Self-pay | Admitting: Cardiology

## 2016-09-19 NOTE — Telephone Encounter (Signed)
Follow Up     Pt calling back regarding insurance

## 2016-09-21 ENCOUNTER — Telehealth: Payer: Self-pay | Admitting: Cardiology

## 2016-09-24 ENCOUNTER — Ambulatory Visit (HOSPITAL_BASED_OUTPATIENT_CLINIC_OR_DEPARTMENT_OTHER): Payer: 59 | Attending: Cardiology | Admitting: Cardiology

## 2016-09-24 VITALS — Ht 62.0 in | Wt 278.0 lb

## 2016-09-24 DIAGNOSIS — G4733 Obstructive sleep apnea (adult) (pediatric): Secondary | ICD-10-CM | POA: Insufficient documentation

## 2016-09-24 DIAGNOSIS — R0902 Hypoxemia: Secondary | ICD-10-CM | POA: Insufficient documentation

## 2016-09-28 ENCOUNTER — Telehealth: Payer: Self-pay | Admitting: *Deleted

## 2016-09-28 NOTE — Telephone Encounter (Signed)
-----   Message from Almyra Free sent at 09/22/2016 10:18 AM EST ----- Regarding: Reschedule study Still have not received insurance information from patient. This will need to be rescheduled. Thanks. Amy

## 2016-09-28 NOTE — Telephone Encounter (Signed)
Called the patient and left a message on cell vm to call back if she still intends on doing the sleep study or not. 09/28/16

## 2016-10-01 NOTE — Procedures (Signed)
   Patient Name: Angela [name], Angela [name] Date: 09/24/2016 Gender: Female D.O.B: Dec 15, 1960 Age (years): 55 Referring Provider: Brien Few Charlcie Cradle PA-C Height (inches): 1 Interpreting Physician: Fransico Him MD, ABSM Weight (lbs): 288 RPSGT: Zadie Rhine BMI: 53 MRN: [id number] Neck Size: 17.00  CLINICAL INFORMATION The patient is referred for a BiPAP titration to treat sleep apnea. Date of NPSG, Split Night or HST: 06/13/2016  SLEEP STUDY TECHNIQUE As per the AASM Manual for the Scoring of Sleep and Associated Events v2.3 (April 2016) with a hypopnea requiring 4% desaturations.  The channels recorded and monitored were frontal, central and occipital EEG, electrooculogram (EOG), submentalis EMG (chin), nasal and oral airflow, thoracic and abdominal wall motion, anterior tibialis EMG, snore microphone, electrocardiogram, and pulse oximetry. Bilevel positive airway pressure (BPAP) was initiated at the beginning of the study and titrated to treat sleep-disordered breathing.  MEDICATIONS Medications self-administered by patient taken the night of the study : BASAGLAR  RESPIRATORY PARAMETERS Optimal IPAP Pressure (cm): 20  AHI at Optimal Pressure (/hr) 0.0 Optimal EPAP Pressure (cm):16     Overall Minimal O2 (%):76.00  Minimal O2 at Optimal Pressure (%): 91.0  SLEEP ARCHITECTURE Start Time:9:44:51 PM  Stop Time:4:20:41 AM  Total Time (min):395.8  Total Sleep Time (min):185.0 Sleep Latency (min):128.9  Sleep Efficiency (%):46.7  REM Latency (min):86.0  WASO (min): 82.0 Stage N1 (%):4.32  Stage N2 (%): 49.73  Stage N3 (%): 20.54  Stage R (%): 25.41 Supine (%):100.00  Arousal Index (/hr):23.7      CARDIAC DATA The 2 lead EKG demonstrated sinus rhythm. The mean heart rate was 83.52 beats per minute. Other EKG findings include: None.  LEG MOVEMENT DATA The total Periodic Limb Movements of Sleep (PLMS) were 0. The PLMS index was 0.00. A PLMS index of <15 is considered normal  in adults.  IMPRESSIONS - An optimal PAP pressure was selected for this patient ( 20/16 cm of water) - Central sleep apnea was not noted during this titration (CAI = 0.0/h). - Severe oxygen desaturations were observed during this titration (min O2 = 76.00%). - No snoring was audible during this study. - No cardiac abnormalities were observed during this study. - Clinically significant periodic limb movements were not noted during this study. Arousals associated with PLMs were rare.  DIAGNOSIS - Obstructive Sleep Apnea (327.23 [G47.33 ICD-10])  - Nocturnal Hypoxemia RECOMMENDATIONS - Trial of BiPAP therapy on 20/16 cm H2O with a Medium size Resmed Full Face Mask AirFit F20 mask and heated humidification. - Avoid alcohol, sedatives and other CNS depressants that may worsen sleep apnea and disrupt normal sleep architecture. - Sleep hygiene should be reviewed to assess factors that may improve sleep quality. - Weight management and regular exercise should be initiated or continued. - Return to Sleep Center for re-evaluation after 10 weeks of therapy  [place], [place] of Sleep Medicine  ELECTRONICALLY SIGNED ON:  10/01/2016, 1:55 PM [place] PH: [phone]   FX: [phone] [place]

## 2016-10-10 ENCOUNTER — Telehealth: Payer: Self-pay | Admitting: *Deleted

## 2016-10-10 NOTE — Telephone Encounter (Signed)
Called and left a message on cell vm to call me back

## 2016-10-10 NOTE — Telephone Encounter (Signed)
-----   Message from Sueanne Margarita, MD sent at 10/01/2016  1:58 PM EDT ----- Pt had successful PAP titration. Please setup appointment in 10 weeks. Please let [facility] know that order for PAP is in EPIC.

## 2016-10-20 ENCOUNTER — Telehealth: Payer: Self-pay | Admitting: *Deleted

## 2016-10-20 NOTE — Telephone Encounter (Signed)
-----   Message from Darlina Guys sent at 10/19/2016 10:23 AM EDT ----- This patient has declined BiPAP set up due to her financial responsibility.   Let us know if we can do anything else. Thanks [employer]

## 2016-10-20 NOTE — Telephone Encounter (Signed)
Called the patient today and offered her the cpap assistance program but due to her financial situation she has declined that program at this time also.

## 2016-12-12 NOTE — Telephone Encounter (Signed)
Please let her know that she would likely not need to pay if can use the financial program for her device

## 2016-12-19 NOTE — Telephone Encounter (Signed)
noted 

## 2016-12-19 NOTE — Telephone Encounter (Signed)
Spoke to patient and let her know that she would likely not need to pay with the CPAP assistance program except for the $100 program fee. Patient informed me that she is working now but her hours are not consistent. She still wants to decline at this time but if she gets more hours she will reconsider.

## 2017-02-27 IMAGING — CR DG CHEST 1V PORT
1 series · 1 of 1 positions shown · non-contrast
Comparison: 09/28/2005

CLINICAL DATA: Atrial fibrillation.  Tachycardia.  Weakness.

EXAM:
PORTABLE CHEST 1 VIEW

[AP]
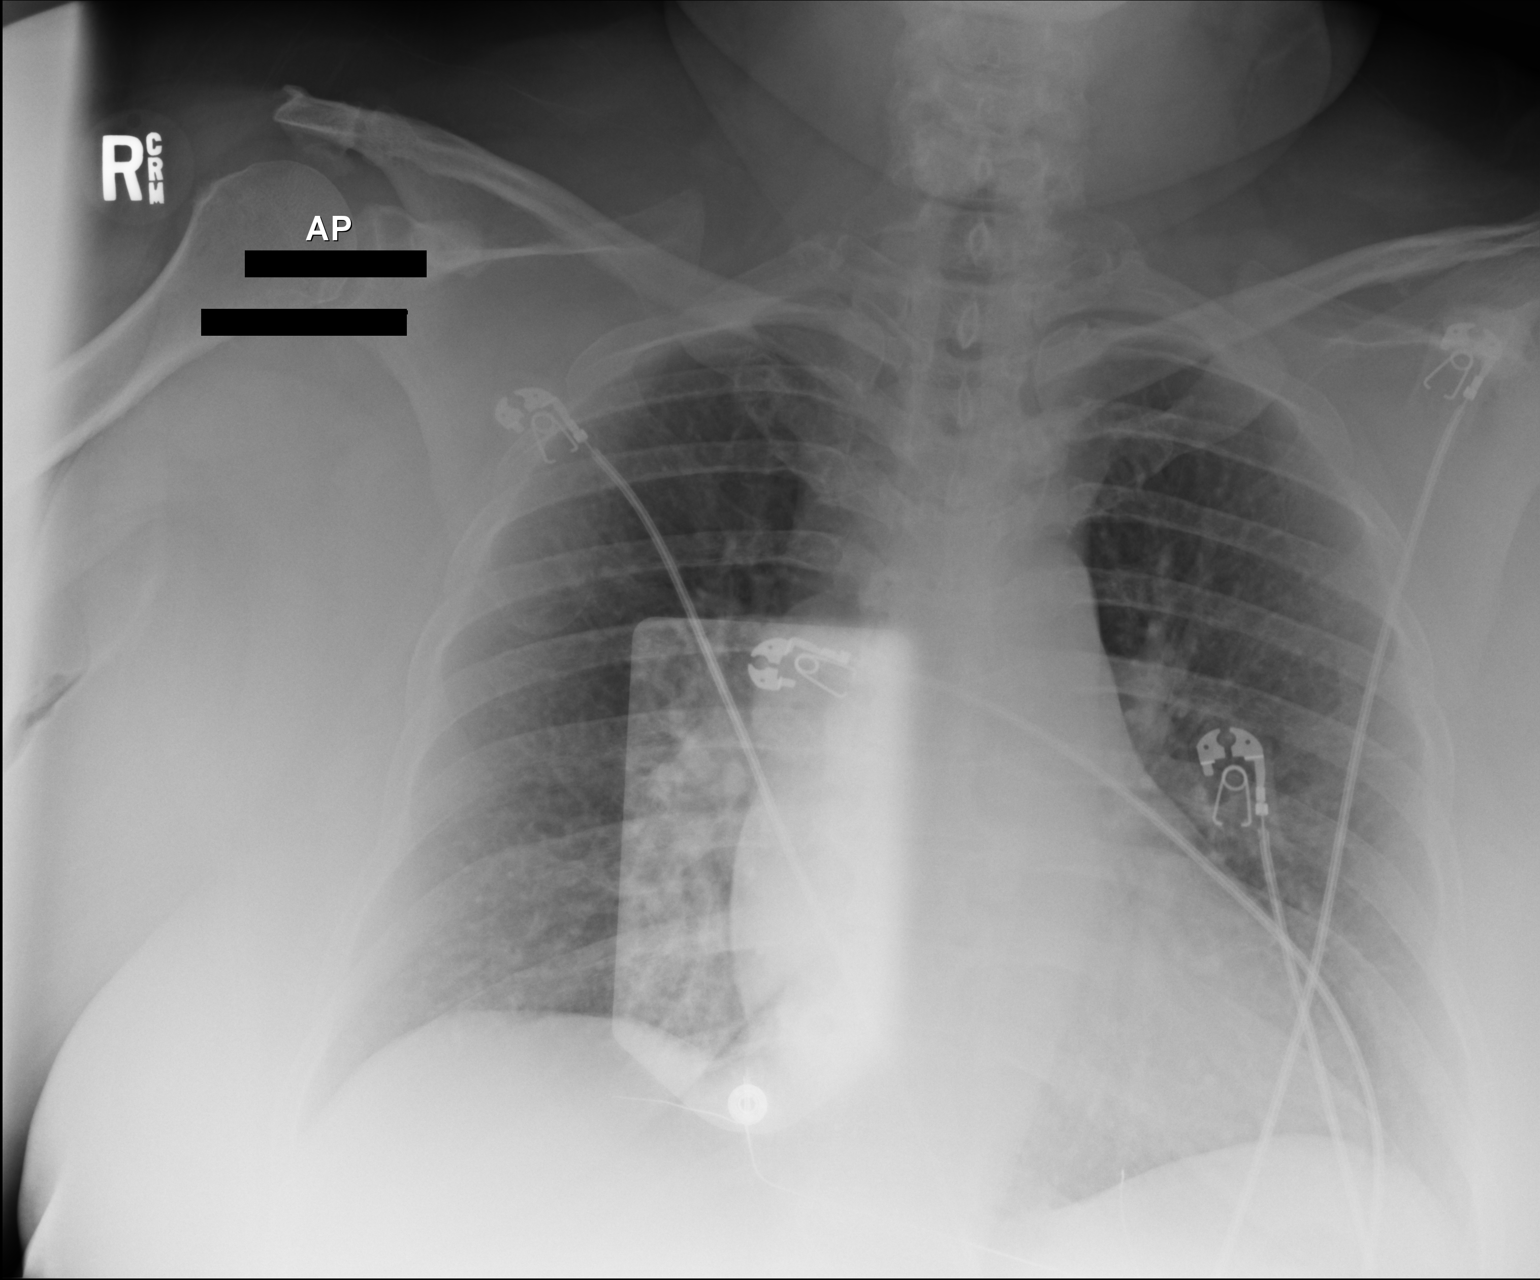

[1 of 1 positions shown; findings below may reference images not displayed]

FINDINGS: Heart size within normal limits for the AP projection. The lungs
appear clear. Mediastinum unremarkable.
IMPRESSION: 1.  No significant abnormality identified.

## 2018-04-24 ENCOUNTER — Encounter (HOSPITAL_COMMUNITY): Payer: Self-pay

## 2018-04-24 ENCOUNTER — Observation Stay (HOSPITAL_COMMUNITY)
Admission: EM | Admit: 2018-04-24 | Discharge: 2018-04-25 | Disposition: A | Discharge status: Home / Self Care | Payer: BLUE CROSS/BLUE SHIELD | Attending: Family Medicine | Admitting: Family Medicine

## 2018-04-24 ENCOUNTER — Emergency Department (HOSPITAL_COMMUNITY): Payer: BLUE CROSS/BLUE SHIELD

## 2018-04-24 DIAGNOSIS — I5033 Acute on chronic diastolic (congestive) heart failure: Secondary | ICD-10-CM | POA: Insufficient documentation

## 2018-04-24 DIAGNOSIS — Z6841 Body Mass Index (BMI) 40.0 and over, adult: Secondary | ICD-10-CM | POA: Insufficient documentation

## 2018-04-24 DIAGNOSIS — E785 Hyperlipidemia, unspecified: Secondary | ICD-10-CM | POA: Insufficient documentation

## 2018-04-24 DIAGNOSIS — Z7901 Long term (current) use of anticoagulants: Secondary | ICD-10-CM | POA: Diagnosis not present

## 2018-04-24 DIAGNOSIS — Z794 Long term (current) use of insulin: Secondary | ICD-10-CM | POA: Insufficient documentation

## 2018-04-24 DIAGNOSIS — D649 Anemia, unspecified: Secondary | ICD-10-CM | POA: Insufficient documentation

## 2018-04-24 DIAGNOSIS — G4733 Obstructive sleep apnea (adult) (pediatric): Secondary | ICD-10-CM

## 2018-04-24 DIAGNOSIS — K219 Gastro-esophageal reflux disease without esophagitis: Secondary | ICD-10-CM | POA: Diagnosis not present

## 2018-04-24 DIAGNOSIS — Z79899 Other long term (current) drug therapy: Secondary | ICD-10-CM | POA: Insufficient documentation

## 2018-04-24 DIAGNOSIS — Z888 Allergy status to other drugs, medicaments and biological substances status: Secondary | ICD-10-CM | POA: Diagnosis not present

## 2018-04-24 DIAGNOSIS — I5031 Acute diastolic (congestive) heart failure: Secondary | ICD-10-CM | POA: Diagnosis present

## 2018-04-24 DIAGNOSIS — I248 Other forms of acute ischemic heart disease: Secondary | ICD-10-CM | POA: Insufficient documentation

## 2018-04-24 DIAGNOSIS — F329 Major depressive disorder, single episode, unspecified: Secondary | ICD-10-CM | POA: Diagnosis not present

## 2018-04-24 DIAGNOSIS — I10 Essential (primary) hypertension: Secondary | ICD-10-CM | POA: Diagnosis present

## 2018-04-24 DIAGNOSIS — Z881 Allergy status to other antibiotic agents status: Secondary | ICD-10-CM | POA: Insufficient documentation

## 2018-04-24 DIAGNOSIS — IMO0002 Reserved for concepts with insufficient information to code with codable children: Secondary | ICD-10-CM | POA: Diagnosis present

## 2018-04-24 DIAGNOSIS — I4891 Unspecified atrial fibrillation: Secondary | ICD-10-CM | POA: Diagnosis present

## 2018-04-24 DIAGNOSIS — I110 Hypertensive heart disease with heart failure: Secondary | ICD-10-CM | POA: Diagnosis not present

## 2018-04-24 DIAGNOSIS — I480 Paroxysmal atrial fibrillation: Secondary | ICD-10-CM | POA: Diagnosis not present

## 2018-04-24 DIAGNOSIS — E1165 Type 2 diabetes mellitus with hyperglycemia: Secondary | ICD-10-CM | POA: Diagnosis not present

## 2018-04-24 HISTORY — DX: Unspecified atrial fibrillation: I48.91

## 2018-04-24 HISTORY — DX: Sleep apnea, unspecified: G47.30

## 2018-04-24 LAB — CBC WITH DIFFERENTIAL/PLATELET
Abs Immature Granulocytes: 0 10*3/uL (ref 0.0–0.1)
Basophils Absolute: 0.1 10*3/uL (ref 0.0–0.1)
Basophils Relative: 1 %
EOS PCT: 0 %
Eosinophils Absolute: 0 10*3/uL (ref 0.0–0.7)
HEMATOCRIT: 35.3 % — AB (ref 36.0–46.0)
HEMOGLOBIN: 10.8 g/dL — AB (ref 12.0–15.0)
Immature Granulocytes: 1 %
LYMPHS ABS: 2.4 10*3/uL (ref 0.7–4.0)
LYMPHS PCT: 28 %
MCH: 27 pg (ref 26.0–34.0)
MCHC: 30.6 g/dL (ref 30.0–36.0)
MCV: 88.3 fL (ref 78.0–100.0)
MONOS PCT: 6 %
Monocytes Absolute: 0.5 10*3/uL (ref 0.1–1.0)
Neutro Abs: 5.5 10*3/uL (ref 1.7–7.7)
Neutrophils Relative %: 64 %
Platelets: 258 10*3/uL (ref 150–400)
RBC: 4 MIL/uL (ref 3.87–5.11)
RDW: 13.4 % (ref 11.5–15.5)
WBC: 8.6 10*3/uL (ref 4.0–10.5)

## 2018-04-24 LAB — BASIC METABOLIC PANEL
ANION GAP: 11 (ref 5–15)
BUN: 17 mg/dL (ref 6–20)
CHLORIDE: 104 mmol/L (ref 98–111)
CO2: 22 mmol/L (ref 22–32)
CREATININE: 0.85 mg/dL (ref 0.44–1.00)
Calcium: 8.9 mg/dL (ref 8.9–10.3)
GFR calc non Af Amer: >60 mL/min (ref >60)
Glucose, Bld: 270 mg/dL — ABNORMAL HIGH (ref 70–99)
Potassium: 4.4 mmol/L (ref 3.5–5.1)
Sodium: 137 mmol/L (ref 135–145)

## 2018-04-24 LAB — BRAIN NATRIURETIC PEPTIDE: B NATRIURETIC PEPTIDE 5: 199.1 pg/mL — AB (ref 0.0–100.0)

## 2018-04-24 LAB — GLUCOSE, CAPILLARY: Glucose-Capillary: 229 mg/dL — ABNORMAL HIGH (ref 70–99)

## 2018-04-24 LAB — TSH: TSH: 1.27 u[IU]/mL (ref 0.350–4.500)

## 2018-04-24 LAB — TROPONIN I

## 2018-04-24 LAB — MAGNESIUM: Magnesium: 1.8 mg/dL (ref 1.7–2.4)

## 2018-04-24 MED ORDER — DILTIAZEM HCL-DEXTROSE 100-5 MG/100ML-% IV SOLN (PREMIX)
5.0000 mg/h | INTRAVENOUS | Status: DC
  Administered 2018-04-24: 5 mg/h via INTRAVENOUS
  Filled 2018-04-24 (×2): qty 100

## 2018-04-24 MED ORDER — DILTIAZEM LOAD VIA INFUSION
15.0000 mg | Freq: Once | INTRAVENOUS | Status: AC
  Administered 2018-04-24: 15 mg via INTRAVENOUS
  Filled 2018-04-24: qty 15

## 2018-04-24 MED ORDER — LISINOPRIL 20 MG PO TABS
40.0000 mg | ORAL_TABLET | Freq: Every day | ORAL | Status: DC
  Administered 2018-04-24 – 2018-04-25 (×2): 40 mg via ORAL
  Filled 2018-04-24 (×3): qty 4
  Filled 2018-04-24: qty 2
  Filled 2018-04-24: qty 4
  Filled 2018-04-24: qty 2

## 2018-04-24 MED ORDER — PANTOPRAZOLE SODIUM 40 MG PO TBEC
80.0000 mg | DELAYED_RELEASE_TABLET | Freq: Every day | ORAL | Status: DC
  Administered 2018-04-25: 80 mg via ORAL
  Filled 2018-04-24: qty 2

## 2018-04-24 MED ORDER — FUROSEMIDE 10 MG/ML IJ SOLN
40.0000 mg | Freq: Once | INTRAMUSCULAR | Status: AC
  Administered 2018-04-24: 40 mg via INTRAVENOUS
  Filled 2018-04-24: qty 4

## 2018-04-24 MED ORDER — DULOXETINE HCL 60 MG PO CPEP
60.0000 mg | ORAL_CAPSULE | Freq: Every day | ORAL | Status: DC
  Administered 2018-04-25: 60 mg via ORAL
  Filled 2018-04-24: qty 1

## 2018-04-24 MED ORDER — APIXABAN 5 MG PO TABS
5.0000 mg | ORAL_TABLET | Freq: Two times a day (BID) | ORAL | Status: DC
  Administered 2018-04-24 – 2018-04-25 (×2): 5 mg via ORAL
  Filled 2018-04-24 (×3): qty 1

## 2018-04-24 MED ORDER — MAGNESIUM SULFATE 2 GM/50ML IV SOLN
2.0000 g | Freq: Once | INTRAVENOUS | Status: AC
  Administered 2018-04-24: 2 g via INTRAVENOUS
  Filled 2018-04-24: qty 50

## 2018-04-24 MED ORDER — METFORMIN HCL ER 500 MG PO TB24
500.0000 mg | ORAL_TABLET | Freq: Every day | ORAL | Status: DC
  Administered 2018-04-25: 500 mg via ORAL
  Filled 2018-04-24: qty 1

## 2018-04-24 MED ORDER — ONDANSETRON HCL 4 MG/2ML IJ SOLN: 4.0000 mg | Freq: Four times a day (QID) | INTRAMUSCULAR | Status: DC | PRN

## 2018-04-24 MED ORDER — FUROSEMIDE 10 MG/ML IJ SOLN: 40.0000 mg | Freq: Every day | INTRAMUSCULAR | Status: DC

## 2018-04-24 MED ORDER — INSULIN ASPART 100 UNIT/ML ~~LOC~~ SOLN
0.0000 [IU] | Freq: Three times a day (TID) | SUBCUTANEOUS | Status: DC
  Administered 2018-04-25: 5 [IU] via SUBCUTANEOUS
  Administered 2018-04-25: 3 [IU] via SUBCUTANEOUS
  Administered 2018-04-25: 8 [IU] via SUBCUTANEOUS

## 2018-04-24 MED ORDER — INSULIN GLARGINE 100 UNIT/ML ~~LOC~~ SOLN
20.0000 [IU] | Freq: Every day | SUBCUTANEOUS | Status: DC
  Administered 2018-04-24: 20 [IU] via SUBCUTANEOUS
  Filled 2018-04-24 (×2): qty 0.2

## 2018-04-24 MED ORDER — ACETAMINOPHEN 325 MG PO TABS: 650.0000 mg | ORAL_TABLET | ORAL | Status: DC | PRN

## 2018-04-24 NOTE — ED Notes (Signed)
Bolus 20 mg Cardizem per Dr. Ellender Hose verbal order

## 2018-04-24 NOTE — ED Triage Notes (Signed)
Pt from Dr. Gabriel Carina via ems; at work this am and started having cp, sob, diaphoreses; went home, pts husband took her to dr; found to be in Afib rvr hx Afib; takes lisinopril; allergic to metoprolol; Afib 120-220; cp 2/10; no current sob, diaphoresis; 324 asa, 1 nitro (at dr. office) pta  163/96 HR 170 98% RA  RR 22, 24 CBG 370

## 2018-04-24 NOTE — ED Provider Notes (Signed)
[place] EMERGENCY DEPARTMENT Provider Note   CSN: [id number] Arrival date & time: 04/24/18  1521     History   Chief Complaint Chief Complaint  Patient presents with  . Atrial Fibrillation    HPI Angela [name] is a 57 y.o. female.  HPI 57 year old female with a history of atrial fibrillation, diabetes, obesity, here with palpitations.  Patient works at [employer].  She states she was at work today when she drank a cold drink.  She explains acute onset of palpitations.  She had associated dull, aching, substernal chest pressure.  She felt short of breath and lightheaded.  She denies any lightheadedness or syncope.  She felt sweaty at the time.  She drove home and her husband had to help her out of the car.  She subsequently asked for EMS transport.  Currently, the patient endorses general fatigue, but denies any ongoing chest pain.  She does have a mild pressure.  She feels somewhat improved.  She has not had any rate control agents.  Of note, the patient does admit to intermittently missing her Eliquis, up to several times a week.  She did not take it this morning.   Past Medical History:  Diagnosis Date  . Complication of anesthesia    had epidural for C-section and epidural did not take until after surgery  . Depression   . Diabetes mellitus without complication ([place])   . GERD (gastroesophageal reflux disease)   . Heart murmur   . Hyperlipidemia   . Hypertension   . Obesity   . Temporal arteritis [facility])     Patient Active Problem List   Diagnosis Date Noted  . Elevated troponin level   . New onset atrial fibrillation ([place]) 02/08/2016  . Temporal arteritis ([place]) 02/08/2016  . Essential hypertension 02/08/2016  . Hypertensive heart disease 02/08/2016  . Hyperlipidemia 02/08/2016  . Morbid obesity ([place]) 02/08/2016  . Diabetes mellitus type 2, uncontrolled ([place]) 02/08/2016  . Atrial fibrillation with RVR ([place]) 02/08/2016    Past Surgical History:    Procedure Laterality Date  . BREAST SURGERY     bilateral breast reduction  . CARDIAC CATHETERIZATION N/A 02/09/2016   Procedure: Left Heart Cath and Coronary Angiography;  Surgeon: Sherren Mocha, MD;  Location: [place] CV LAB;  Service: Cardiovascular;  Laterality: N/A;  . COLONOSCOPY    . TUBAL LIGATION       OB History   None      Home Medications    Prior to Admission medications   Medication Sig Start Date End Date Taking? Authorizing Provider  apixaban (ELIQUIS) 5 MG TABS tablet Take 1 tablet (5 mg total) by mouth 2 (two) times daily. 03/13/16  Yes Baldwin Jamaica, PA-C  DULoxetine (CYMBALTA) 60 MG capsule Take 60 mg by mouth daily.   Yes [provider]  esomeprazole (NEXIUM) 40 MG capsule Take 40 mg by mouth daily.   Yes [provider]  lisinopril (PRINIVIL,ZESTRIL) 40 MG tablet Take 40 mg by mouth daily. 03/22/16  Yes [provider]  metFORMIN (GLUCOPHAGE-XR) 500 MG 24 hr tablet Take 500 mg by mouth daily. 03/24/16  Yes [provider]  Insulin Glargine (BASAGLAR KWIKPEN) 100 UNIT/ML SOPN Inject 50 Units into the skin daily. Takes 30 units every night but if she takes prednisone she takes twice daily according to sliding scale    [provider]  triamterene-hydrochlorothiazide (MAXZIDE-25) 37.5-25 MG tablet Take 1 tablet by mouth daily. Patient not taking: Reported on  04/24/2018 02/22/16   Baldwin Jamaica, PA-C    Family History Family History  Problem Relation Age of Onset  . Kidney failure Father   . CAD Unknown        Several aunts had MIs  . Alzheimer's disease Unknown   . Diabetes Unknown     Social History Social History   Tobacco Use  . Smoking status: Never Smoker  . Smokeless tobacco: Never Used  Substance Use Topics  . Alcohol use: Yes    Alcohol/week: 0.0 standard drinks    Comment: rare  . Drug use: No     Allergies   E-mycin [erythromycin]; Avocado; Banana; Glucophage [metformin hcl]; Kiwi  extract; Mango flavor; Metoprolol; Xarelto [rivaroxaban]; Levemir [insulin detemir]; and Lexapro [escitalopram oxalate]   Review of Systems Review of Systems  Constitutional: Positive for fatigue. Negative for chills and fever.  HENT: Negative for congestion and rhinorrhea.   Eyes: Negative for visual disturbance.  Respiratory: Positive for chest tightness and shortness of breath. Negative for cough and wheezing.   Cardiovascular: Positive for chest pain and palpitations. Negative for leg swelling.  Gastrointestinal: Negative for abdominal pain, diarrhea, nausea and vomiting.  Genitourinary: Negative for dysuria and flank pain.  Musculoskeletal: Negative for neck pain and neck stiffness.  Skin: Negative for rash and wound.  Allergic/Immunologic: Negative for immunocompromised state.  Neurological: Positive for weakness. Negative for syncope and headaches.  All other systems reviewed and are negative.    Physical Exam Updated Vital Signs BP (!) 171/98   Pulse (!) 46   Temp 98.1 F (36.7 C) (Oral)   Resp (!) 30   Ht 5\' 2"  (1.575 m)   Wt 119.7 kg   SpO2 100%   BMI 48.29 kg/m   Physical Exam  Constitutional: She is oriented to person, place, and time. She appears well-developed and well-nourished. No distress.  HENT:  Head: Normocephalic and atraumatic.  Mouth/Throat: Oropharynx is clear and moist.  Eyes: Conjunctivae are normal.  Neck: Neck supple.  Cardiovascular: Normal heart sounds. An irregularly irregular rhythm present. Tachycardia present. Exam reveals no friction rub.  No murmur heard. Pulmonary/Chest: Effort normal and breath sounds normal. No respiratory distress. She has no wheezes. She has no rales.  Abdominal: She exhibits no distension.  Musculoskeletal: She exhibits no edema.  Neurological: She is alert and oriented to person, place, and time. She exhibits normal muscle tone.  Skin: Skin is warm. Capillary refill takes less than 2 seconds.  Psychiatric: She  has a normal mood and affect.  Nursing note and vitals reviewed.    ED Treatments / Results  Labs (all labs ordered are listed, but only abnormal results are displayed) Labs Reviewed  CBC WITH DIFFERENTIAL/PLATELET - Abnormal; Notable for the following components:      Result Value   Hemoglobin 10.8 (*)    HCT 35.3 (*)    All other components within normal limits  BASIC METABOLIC PANEL - Abnormal; Notable for the following components:   Glucose, Bld 270 (*)    All other components within normal limits  BRAIN NATRIURETIC PEPTIDE - Abnormal; Notable for the following components:   B Natriuretic Peptide 199.1 (*)    All other components within normal limits  MAGNESIUM  TROPONIN I  TSH    EKG EKG Interpretation  Date/Time:  Wednesday April 24 2018 16:30:46 EDT Ventricular Rate:  122 PR Interval:    QRS Duration: 79 QT Interval:  286 QTC Calculation: 408 R Axis:   52 Text Interpretation:  Atrial fibrillation Ventricular premature complex Borderline repolarization abnormality Since last EKG, AFib has replaced sinus rhythm Non-specific ST changes, likely rate-related Confirmed by Duffy Bruce [phone]) on 04/24/2018 6:24:02 PM   Radiology Dg Chest 2 View  Result Date: 04/24/2018 CLINICAL DATA:  Chest pain shortness of breath, diabetes and hypertension EXAM: CHEST - 2 VIEW COMPARISON:  02/08/2016 FINDINGS: Mild cardiomegaly with nonspecific diffuse reticulonodular interstitial opacities may represent edema versus interstitial lung disease. Atypical infection could have a similar appearance. No effusion or pneumothorax. Trachea is midline. Degenerative changes of the spine. IMPRESSION: Cardiomegaly with diffuse reticulonodular interstitial opacities. See above comment. Electronically Signed   By: Jerilynn Mages.  Shick M.D.   On: 04/24/2018 18:09    Procedures .Critical Care Performed by: Duffy Bruce, MD Authorized by: Duffy Bruce, MD   Critical care provider statement:     Critical care time (minutes):  35   Critical care time was exclusive of:  Separately billable procedures and treating other patients and teaching time   Critical care was necessary to treat or prevent imminent or life-threatening deterioration of the following conditions:  Circulatory failure, respiratory failure and cardiac failure   Critical care was time spent personally by me on the following activities:  Development of treatment plan with patient or surrogate, discussions with consultants, evaluation of patient's response to treatment, examination of patient, obtaining history from patient or surrogate, ordering and performing treatments and interventions, ordering and review of laboratory studies, ordering and review of radiographic studies, pulse oximetry, re-evaluation of patient's condition and review of old charts   I assumed direction of critical care for this patient from another provider in my specialty: no     (including critical care time)  Medications Ordered in ED Medications  diltiazem (CARDIZEM) 1 mg/mL load via infusion 15 mg (15 mg Intravenous Bolus from Bag 04/24/18 1550)    And  diltiazem (CARDIZEM) 100 mg in dextrose 5% 133mL (1 mg/mL) infusion (15 mg/hr Intravenous Rate/Dose Change 04/24/18 1832)  apixaban (ELIQUIS) tablet 5 mg (5 mg Oral Given 04/24/18 1718)  magnesium sulfate IVPB 2 g 50 mL (has no administration in time range)  furosemide (LASIX) injection 40 mg (40 mg Intravenous Given 04/24/18 1902)     Initial Impression / Assessment and Plan / ED Course  I have reviewed the triage vital signs and the nursing notes.  Pertinent labs & imaging results that were available during my care of the patient were reviewed by me and considered in my medical decision making (see chart for details).     57 year old female here with recurrent A. fib RVR.  Patient admits to missing doses of her Eliquis, so will not cardiovert at this time.  Will start on diltiazem bolus and  drip, give her dose of Eliquis here.  Patient otherwise well-appearing.  I suspect her chest pressure is related to demand ischemia from her A. fib.  No overt ST elevations on her EKG.  She had a cath in 2017 without blockages.  Plan to admit to medicine  Patient with improvement in heart rate to the 120s, though she remains persistently tachycardic.  Given her noncompliance with Eliquis, I am hesitant to perform any kind of cardioversion, either chemically or electrically.  Discussed with the cardiologist on-call, Dr. Irish Lack.  He is in agreement.  She has a reported allergy to beta-blockers.  Will diurese her, continue diltiazem drip, and admit to the stepdown.   Final Clinical Impressions(s) / ED Diagnoses   Final diagnoses:  Atrial fibrillation with  RVR [facility])    ED Discharge Orders    None       Duffy Bruce, MD 04/24/18 1909

## 2018-04-24 NOTE — Progress Notes (Signed)
Received report from ED.  

## 2018-04-24 NOTE — H&P (Signed)
History and Physical    Angela [name]:[id number] DOB: 04-Jun-1961 DOA: 04/24/2018  PCP: Cari Caraway, MD  Patient coming from: Home  I have personally briefly reviewed patient's old medical records in [place]  Chief Complaint: Palpitations  HPI: Angela [name] is a 57 y.o. female with medical history significant of PAF on eliquis, DM2, obesity, OSA, HTN, temporal arteritis back in 2017.  Patient developed acute onset of palpitations while at work today.  Associated dull, aching, substernal chest pressure.  She felt short of breath and lightheaded.  She drove home and her husband had to help her out of the car.  She subsequently asked for EMS transport.  ED Course: Currently, the patient endorses general fatigue, but denies any ongoing chest pain.  She does have a mild pressure.  She feels somewhat improved.  She has not had any rate control agents.  Of note, the patient does admit to intermittently missing her Eliquis, up to several times a week.  She did not take it this morning.  Found to be in A.Fib with RVR, BNP 199, CXR shows possible pulm edema.  Given lasix 40, cardizem bolus and gtt.  Given PO eliquis.  EDP and cards elected not to cardiovert given missed eliquis doses.  Review of Systems: As per HPI otherwise 10 point review of systems negative.   Past Medical History:  Diagnosis Date  . Complication of anesthesia    had epidural for C-section and epidural did not take until after surgery  . Depression   . Diabetes mellitus without complication ([place])   . GERD (gastroesophageal reflux disease)   . Heart murmur   . Hyperlipidemia   . Hypertension   . Obesity   . Temporal arteritis [facility])     Past Surgical History:  Procedure Laterality Date  . BREAST SURGERY     bilateral breast reduction  . CARDIAC CATHETERIZATION N/A 02/09/2016   Procedure: Left Heart Cath and Coronary Angiography;  Surgeon: Sherren Mocha, MD;  Location: [place] CV LAB;  Service:  Cardiovascular;  Laterality: N/A;  . COLONOSCOPY    . TUBAL LIGATION       reports that she has never smoked. She has never used smokeless tobacco. She reports that she drinks alcohol. She reports that she does not use drugs.  Allergies  Allergen Reactions  . E-Mycin [Erythromycin] Nausea And Vomiting  . Avocado Diarrhea and Nausea And Vomiting  . Banana Diarrhea and Nausea And Vomiting  . Glucophage [Metformin Hcl] Diarrhea and Nausea And Vomiting  . Kiwi Extract Diarrhea and Nausea And Vomiting  . Mango Flavor Diarrhea  . Metoprolol     Unclear, patient was also started on Xarelto, developed severe itching and LE weakness, stopped both and symptoms resolved  . Xarelto [Rivaroxaban]     Unclear, patient was also started on Metoprolol, developed severe itching/LE weakness, stopped both and her and symptoms resolved  . Levemir [Insulin Detemir] Rash  . Lexapro [Escitalopram Oxalate] Itching and Other (See Comments)    Numb lips    Family History  Problem Relation Age of Onset  . Kidney failure Father   . CAD Unknown        Several aunts had MIs  . Alzheimer's disease Unknown   . Diabetes Unknown      Prior to Admission medications   Medication Sig Start Date End Date Taking? Authorizing Provider  apixaban (ELIQUIS) 5 MG TABS tablet Take 1 tablet (5 mg total) by mouth 2 (two)  times daily. 03/13/16  Yes Baldwin Jamaica, PA-C  DULoxetine (CYMBALTA) 60 MG capsule Take 60 mg by mouth daily.   Yes [provider]  esomeprazole (NEXIUM) 40 MG capsule Take 40 mg by mouth daily.   Yes [provider]  lisinopril (PRINIVIL,ZESTRIL) 40 MG tablet Take 40 mg by mouth daily. 03/22/16  Yes [provider]  metFORMIN (GLUCOPHAGE-XR) 500 MG 24 hr tablet Take 500 mg by mouth daily. 03/24/16  Yes [provider]  Insulin Glargine (BASAGLAR KWIKPEN) 100 UNIT/ML SOPN Inject 50 Units into the skin daily. Takes 30 units every night but if she takes prednisone she  takes twice daily according to sliding scale    [provider]    Physical Exam: Vitals:   04/24/18 1745 04/24/18 1800 04/24/18 1830 04/24/18 1845  BP: (!) 170/105 (!) 171/98 (!) 140/95 134/81  Pulse: (!) 132 (!) 46 (!) 104 (!) 112  Resp: (!) 22 (!) 30 17 (!) 25  Temp:      TempSrc:      SpO2: 100% 100% 98% 97%  Weight:      Height:        Constitutional: NAD, calm, comfortable Eyes: PERRL, lids and conjunctivae normal ENMT: Mucous membranes are moist. Posterior pharynx clear of any exudate or lesions.Normal dentition.  Neck: normal, supple, no masses, no thyromegaly Respiratory: clear to auscultation bilaterally, no wheezing, no crackles. Normal respiratory effort. No accessory muscle use.  Cardiovascular: Tachycardic, irr, irr Abdomen: no tenderness, no masses palpated. No hepatosplenomegaly. Bowel sounds positive.  Musculoskeletal: no clubbing / cyanosis. No joint deformity upper and lower extremities. Good ROM, no contractures. Normal muscle tone.  Skin: no rashes, lesions, ulcers. No induration Neurologic: CN 2-12 grossly intact. Sensation intact, DTR normal. Strength 5/5 in all 4.  Psychiatric: Normal judgment and insight. Alert and oriented x 3. Normal mood.    Labs on Admission: I have personally reviewed following labs and imaging studies  CBC: Recent Labs  Lab 04/24/18 1547  WBC 8.6  NEUTROABS 5.5  HGB 10.8*  HCT 35.3*  MCV 88.3  PLT 287   Basic Metabolic Panel: Recent Labs  Lab 04/24/18 1547  NA 137  K 4.4  CL 104  CO2 22  GLUCOSE 270*  BUN 17  CREATININE 0.85  CALCIUM 8.9  MG 1.8   GFR: Estimated Creatinine Clearance: 88.8 mL/min (by C-G formula based on SCr of 0.85 mg/dL). Liver Function Tests: No results for input(s): AST, ALT, ALKPHOS, BILITOT, PROT, ALBUMIN in the last 168 hours. No results for input(s): LIPASE, AMYLASE in the last 168 hours. No results for input(s): AMMONIA in the last 168 hours. Coagulation Profile: No results  for input(s): INR, PROTIME in the last 168 hours. Cardiac Enzymes: Recent Labs  Lab 04/24/18 1547  TROPONINI <0.03   BNP (last 3 results) No results for input(s): PROBNP in the last 8760 hours. HbA1C: No results for input(s): HGBA1C in the last 72 hours. CBG: No results for input(s): GLUCAP in the last 168 hours. Lipid Profile: No results for input(s): CHOL, HDL, LDLCALC, TRIG, CHOLHDL, LDLDIRECT in the last 72 hours. Thyroid Function Tests: Recent Labs    04/24/18 1600  TSH 1.270   Anemia Panel: No results for input(s): VITAMINB12, FOLATE, FERRITIN, TIBC, IRON, RETICCTPCT in the last 72 hours. Urine analysis: No results found for: COLORURINE, APPEARANCEUR, LABSPEC, [place], GLUCOSEU, HGBUR, BILIRUBINUR, KETONESUR, PROTEINUR, UROBILINOGEN, NITRITE, LEUKOCYTESUR  Radiological Exams on Admission: Dg Chest 2 View  Result Date: 04/24/2018 CLINICAL DATA:  Chest pain shortness of breath, diabetes and hypertension EXAM: CHEST - 2 VIEW COMPARISON:  02/08/2016 FINDINGS: Mild cardiomegaly with nonspecific diffuse reticulonodular interstitial opacities may represent edema versus interstitial lung disease. Atypical infection could have a similar appearance. No effusion or pneumothorax. Trachea is midline. Degenerative changes of the spine. IMPRESSION: Cardiomegaly with diffuse reticulonodular interstitial opacities. See above comment. Electronically Signed   By: Jerilynn Mages.  Shick M.D.   On: 04/24/2018 18:09    EKG: Independently reviewed.  Assessment/Plan Principal Problem:   Atrial fibrillation with RVR (HCC) Active Problems:   Essential hypertension   Diabetes mellitus type 2, uncontrolled (HCC)   Acute diastolic CHF (congestive heart failure) (HCC)   OSA (obstructive sleep apnea)    1. A.Fib RVR - 1. A.Fib pathway 2. cardizem gtt 3. Continue eliquis 2. Apparent acute diastolic CHF - 1. Lasix 40mg  IV x1 in ED then 40mg  IV daily 2. 2d echo 3. Intake and output 3. OSA - 1. Patient  says she is unable to afford CPAP/BIPAP so doesn't wear at night 4. DM2 - 1. Continue metformin 2. lantus 20u QHS 3. Mod scale SSI AC 5. HTN - 1. Continue lisinopril  DVT prophylaxis: Eliquis Code Status: Full Family Communication: No family in room Disposition Plan: Home after admit Consults called: None Admission status: Place in obs   Hyde Sires, [place] [facility] Pager [phone] Only works nights!  If 7AM-7PM, please contact the primary day team physician taking care of patient  [email or web address] Password [facility]  04/24/2018, 7:32 PM

## 2018-04-25 ENCOUNTER — Encounter (HOSPITAL_COMMUNITY): Payer: Self-pay | Admitting: General Practice

## 2018-04-25 ENCOUNTER — Other Ambulatory Visit: Payer: Self-pay

## 2018-04-25 ENCOUNTER — Observation Stay (HOSPITAL_BASED_OUTPATIENT_CLINIC_OR_DEPARTMENT_OTHER): Payer: BLUE CROSS/BLUE SHIELD

## 2018-04-25 DIAGNOSIS — I4891 Unspecified atrial fibrillation: Secondary | ICD-10-CM | POA: Diagnosis not present

## 2018-04-25 DIAGNOSIS — I248 Other forms of acute ischemic heart disease: Secondary | ICD-10-CM

## 2018-04-25 DIAGNOSIS — I5031 Acute diastolic (congestive) heart failure: Secondary | ICD-10-CM

## 2018-04-25 DIAGNOSIS — I340 Nonrheumatic mitral (valve) insufficiency: Secondary | ICD-10-CM | POA: Diagnosis not present

## 2018-04-25 LAB — ECHOCARDIOGRAM COMPLETE
Height: 62 in
Weight: 4172.87 oz

## 2018-04-25 LAB — HIV ANTIBODY (ROUTINE TESTING W REFLEX): HIV Screen 4th Generation wRfx: NONREACTIVE

## 2018-04-25 LAB — TROPONIN I
TROPONIN I: 0.05 ng/mL — AB (ref <0.03)
Troponin I: 0.07 ng/mL (ref <0.03)

## 2018-04-25 LAB — GLUCOSE, CAPILLARY
GLUCOSE-CAPILLARY: 279 mg/dL — AB (ref 70–99)
GLUCOSE-CAPILLARY: 246 mg/dL — AB (ref 70–99)
Glucose-Capillary: 196 mg/dL — ABNORMAL HIGH (ref 70–99)

## 2018-04-25 NOTE — Progress Notes (Signed)
Pt discharged home with husband.  All instructions given and reviewed, all questions answered.  Follow up appts in place.

## 2018-04-25 NOTE — Progress Notes (Signed)
Results for PLUMA, DINIZ (MRN [id number]) as of 04/25/2018 12:20  Ref. Range 04/24/2018 21:28 04/25/2018 06:17 04/25/2018 11:33  Glucose-Capillary Latest Ref Range: 70 - 99 mg/dL 229 (H) 279 (H) 246 (H)   Inpatient Diabetes Program Recommendations  AACE/ADA: New Consensus Statement on Inpatient Glycemic Control (2015)  Target Ranges:  Prepandial:   less than 140 mg/dL      Peak postprandial:   less than 180 mg/dL (1-2 hours)      Critically ill patients:  140 - 180 mg/dL   Lab Results  Component Value Date   GLUCAP 246 (H) 04/25/2018   HGBA1C 12.5 (H) 02/02/2016    Review of Glycemic Control  Diabetes history: Type 2 Outpatient Diabetes medications: Basalglar 30 units daily (takes BID if on prednisone), Metformin Current orders for Inpatient glycemic control: Lantus 20 units every HS, Novolog 0-15 units TID, Metformin  Inpatient Diabetes Program Recommendations:    If blood sugars continue to be elevated, recommend increasing Lantus to 30 units daily since that is the Baslaglar home dose.  Continue current Novolog orders as written.   Harvel Ricks RN BSN CDE Diabetes Coordinator Pager: [phone]  8am-5pm

## 2018-04-25 NOTE — Discharge Instructions (Signed)

## 2018-04-25 NOTE — Discharge Summary (Signed)
Physician Discharge Summary  Angela [name] BPZ:[id number] DOB: [birth year] DOA: 04/24/2018  PCP: Angela Caraway, MD  Admit date: 04/24/2018 Discharge date: 04/25/2018  Recommendations for Outpatient Follow-up:  1. PAF, refuses metoprolol, diltiazem, rate-control. 2. Abnormal CXR, diffuse reticulonodular interstitial opacities. No clinical evidence to suggest infection. Ddx includes edema vs interstitial lung disease. --repeat CXR as outpatient.  Follow-up Information    Angela Berthold, NP Follow up on 05/15/2018.   Specialty:  Cardiology Why:  12:20 PM Contact information: [place] [id number] [phone]            Discharge Diagnoses:  1. Afib RVR, known dx, CHA2DS2-VASc 4 2. Demand ischemia 3. Acute on chronic diastolic CHF 4. DM type 2 5. Essential HTN 6. Normocytic anemia 7. Abnormal CXR, diffuse reticulonodular interstitial opacities. 8. Morbid obesity Body mass index is 47.7 kg/m.  Discharge Condition: improved Disposition: home  Diet recommendation: heart healthy, diabetic diet  Filed Weights   04/24/18 1529 04/25/18 0036 04/25/18 0402  Weight: 119.7 kg 119.1 kg 118.3 kg    History of present illness:  57yow PMH PAF on apixaban, presented with acute palpiatations at work, chest pressure. Admitted for afib RVR, acute diastolic CHF.  Hospital Course:  Treated with diltiazem infusion with spontaneous conversion to sinus rhythm. Chart reviewed, last seen by cardiology 2017 at which time she was not on rate control agents.  Possible allergy to metoprolol.  Previously on Cardizem but patient stopped it secondary to itching, unclear whether was related to this medication.  Discussed with patient she prefers to add no medications at this point and has had only one episode of palpitations recently.  Plan outpatient follow-up for further discussion of rate control.  Echocardiogram was reassuring.  Afib RVR, known dx, CHA2DS2-VASc 4, with  associated demand ischemia, chest pressure; not on rate-control agent. "Allergy" to metoprolol. TSH WNL. Last seen by Ms Angela [name] 03/2016, not on rate-control agent then.  Echocardiogram reassuring. --continue apixaban  Demand ischemia. LHC 01/2016 with normal coronary arteries. "Suspect demand ischemia from Afib with RVR."  Echocardiogram reassuring.  Acute diastolic CHF --Echo noted as below  DM type 2 --Stable.  Continue insulin glargine. Hold metformin. .   Essential HTN --continue Maxide, lisinopril  Normocytic anemia --stable, f/u as an outpatient  Abnormal CXR, diffuse reticulonodular interstitial opacities. No clinical evidence to suggest infection. Ddx includes edema vs interstitial lung disease. --repeat CXR as outpatient.  Morbid obesity Body mass index is 47.7 kg/m. --f/u as an outpatient  Consultants:  None   Procedures:  Echo Study Conclusions  - Left ventricle: The cavity size was normal. Wall thickness was   increased in a pattern of mild LVH. Systolic function was   vigorous. The estimated ejection fraction was in the range of 65%   to 70%. - Mitral valve: There was mild regurgitation.  Today's assessment: S: feels better, no CP, no SOB. O: Vitals:  Vitals:   04/25/18 0800 04/25/18 1215  BP: 140/68 (!) 177/83  Pulse: 65 70  Resp: 19 (!) 23  Temp: 98.2 F (36.8 C) 98 F (36.7 C)  SpO2: 100% 96%    Constitutional:  . Appears calm and comfortable ENMT:  . grossly normal hearing  Respiratory:  . CTA bilaterally, no w/r/r.  . Respiratory effort normal.  Cardiovascular:  . RRR, no m/r/g . No LE extremity edema   . Telemetry SR Psychiatric:  . judgement and insight appear normal . Mental status o Mood, affect appropriate  troponins flat,  most recent .05   Discharge Instructions  Discharge Instructions    Diet - low sodium heart healthy   Complete by:  As directed    Discharge instructions   Complete by:  As directed    Call  your physician or seek immediate medical attention for chest pain, palpitations, heart racing, shortness of breath, welling or worsening of condition.   Increase activity slowly   Complete by:  As directed      Allergies as of 04/25/2018      Reactions   E-mycin [erythromycin] Nausea And Vomiting   Avocado Diarrhea, Nausea And Vomiting   Banana Diarrhea, Nausea And Vomiting   Glucophage [metformin Hcl] Diarrhea, Nausea And Vomiting   Kiwi Extract Diarrhea, Nausea And Vomiting   Mango Flavor Diarrhea   Metoprolol    Unclear, patient was also started on Xarelto, developed severe itching and LE weakness, stopped both and symptoms resolved   Xarelto [rivaroxaban]    Unclear, patient was also started on Metoprolol, developed severe itching/LE weakness, stopped both and her and symptoms resolved   Levemir [insulin Detemir] Rash   Lexapro [escitalopram Oxalate] Itching, Other (See Comments)   Numb lips      Medication List    TAKE these medications   apixaban 5 MG Tabs tablet Commonly known as:  ELIQUIS Take 1 tablet (5 mg total) by mouth 2 (two) times daily.   BASAGLAR KWIKPEN 100 UNIT/ML Sopn Inject 50 Units into the skin daily. Takes 30 units every night but if she takes prednisone she takes twice daily according to sliding scale   DULoxetine 60 MG capsule Commonly known as:  CYMBALTA Take 60 mg by mouth daily.   esomeprazole 40 MG capsule Commonly known as:  NEXIUM Take 40 mg by mouth daily.   lisinopril 40 MG tablet Commonly known as:  PRINIVIL,ZESTRIL Take 40 mg by mouth daily.   metFORMIN 500 MG 24 hr tablet Commonly known as:  GLUCOPHAGE-XR Take 500 mg by mouth daily.      Allergies  Allergen Reactions  . E-Mycin [Erythromycin] Nausea And Vomiting  . Avocado Diarrhea and Nausea And Vomiting  . Banana Diarrhea and Nausea And Vomiting  . Glucophage [Metformin Hcl] Diarrhea and Nausea And Vomiting  . Kiwi Extract Diarrhea and Nausea And Vomiting  . Mango Flavor  Diarrhea  . Metoprolol     Unclear, patient was also started on Xarelto, developed severe itching and LE weakness, stopped both and symptoms resolved  . Xarelto [Rivaroxaban]     Unclear, patient was also started on Metoprolol, developed severe itching/LE weakness, stopped both and her and symptoms resolved  . Levemir [Insulin Detemir] Rash  . Lexapro [Escitalopram Oxalate] Itching and Other (See Comments)    Numb lips    The results of significant diagnostics from this hospitalization (including imaging, microbiology, ancillary and laboratory) are listed below for reference.    Significant Diagnostic Studies: Dg Chest 2 View  Result Date: 04/24/2018 CLINICAL DATA:  Chest pain shortness of breath, diabetes and hypertension EXAM: CHEST - 2 VIEW COMPARISON:  02/08/2016 FINDINGS: Mild cardiomegaly with nonspecific diffuse reticulonodular interstitial opacities may represent edema versus interstitial lung disease. Atypical infection could have a similar appearance. No effusion or pneumothorax. Trachea is midline. Degenerative changes of the spine. IMPRESSION: Cardiomegaly with diffuse reticulonodular interstitial opacities. See above comment. Electronically Signed   By: Jerilynn Mages.  Shick M.D.   On: 04/24/2018 18:09   Labs: Basic Metabolic Panel: Recent Labs  Lab 04/24/18 1547  NA 137  K 4.4  CL 104  CO2 22  GLUCOSE 270*  BUN 17  CREATININE 0.85  CALCIUM 8.9  MG 1.8   CBC: Recent Labs  Lab 04/24/18 1547  WBC 8.6  NEUTROABS 5.5  HGB 10.8*  HCT 35.3*  MCV 88.3  PLT 258   Cardiac Enzymes: Recent Labs  Lab 04/24/18 1547 04/25/18 0232 04/25/18 0708  TROPONINI <0.03 0.07* 0.05*    Recent Labs    04/24/18 1547  BNP 199.1*   CBG: Recent Labs  Lab 04/24/18 2128 04/25/18 0617 04/25/18 1133  GLUCAP 229* 279* 246*    Principal Problem:   Atrial fibrillation with RVR (HCC) Active Problems:   Essential hypertension   Diabetes mellitus type 2, uncontrolled (HCC)   Acute  diastolic CHF (congestive heart failure) (HCC)   OSA (obstructive sleep apnea)   Time coordinating discharge: 35 minutes  Signed:  Murray Hodgkins, MD [facility] 04/25/2018, 12:55 PM

## 2018-04-25 NOTE — Progress Notes (Signed)
Pt ambulated 900 ft, slow steady gait, no complaints.  HR to 110 during walk, appeared to stay regular; back to 80's after a few mins of sitting.  Will proceed with discharge home.

## 2018-04-25 NOTE — Progress Notes (Signed)
  Echocardiogram 2D Echocardiogram has been performed.  Merrie Roof F 04/25/2018, 11:16 AM

## 2018-04-25 NOTE — Progress Notes (Signed)
@  0113 Paged Lamar Blinks of [facility] regarding pt's conversion to NSR. No orders received. Will maintain gtt rate until Day Team can assess.  @0414  Paged K. Schorr regarding critical Troponin of 0.07 (previous >0.03). No orders received. Will continue to monitor.

## 2018-05-15 ENCOUNTER — Ambulatory Visit: Payer: Self-pay | Admitting: Nurse Practitioner

## 2018-05-20 ENCOUNTER — Ambulatory Visit: Payer: BLUE CROSS/BLUE SHIELD | Admitting: Podiatry

## 2018-05-20 ENCOUNTER — Ambulatory Visit: Payer: Self-pay

## 2018-05-20 DIAGNOSIS — R600 Localized edema: Secondary | ICD-10-CM

## 2018-05-20 DIAGNOSIS — M659 Synovitis and tenosynovitis, unspecified: Secondary | ICD-10-CM

## 2018-05-20 DIAGNOSIS — M7750 Other enthesopathy of unspecified foot: Secondary | ICD-10-CM

## 2018-05-21 NOTE — Progress Notes (Signed)
   Subjective:  57 year old female with PMHx of DM presenting today as a new patient, referred by Dr. Gershon Mussel, with a chief complaint of bilateral foot pain that has been ongoing for the past few months. She was told she has heel spurs in the past. She reports working on concrete floors all day which increases the pain. She has not done anything for treatment. She reports h/o lymphedema of the RLE. Patient is here for further evaluation and treatment.   Past Medical History:  Diagnosis Date  . Atrial fibrillation with RVR ([place])   . Complication of anesthesia    had epidural for C-section and epidural did not take until after surgery  . Depression   . Diabetes mellitus without complication ([place])   . GERD (gastroesophageal reflux disease)   . Heart murmur   . Hyperlipidemia   . Hypertension   . Obesity   . Sleep apnea   . Temporal arteritis ([place])     Objective / Physical Exam:  General:  The patient is alert and oriented x3 in no acute distress. Dermatology:  Skin is warm, dry and supple bilateral lower extremities. Negative for open lesions or macerations. Vascular:  Palpable pedal pulses bilaterally. Capillary refill within normal limits. Extensive edema noted to the bilateral lower extremities with hemosiderin discoloration around the ankle goiter area bilaterally.   Neurological:  Epicritic and protective threshold diminished bilaterally.  Musculoskeletal Exam:  Pain on palpation to the anterior lateral medial aspects of the patient's left ankle. Range of motion within normal limits to all pedal and ankle joints bilateral. Muscle strength 5/5 in all groups bilateral.   Assessment: 1. Left ankle synovitis  2. BLE edema   Plan of Care:  1. Patient was evaluated. X-Rays brought in by patient reviewed.  2. injection of 0.5 mL Celestone Soluspan injected in the patient's left ankle. 3. Recommended daily compression hose.  4. Continue wearing good shoe gear.  5. Return to clinic in  4 weeks.   Works at [employer].   Edrick Kins, DPM [facility]  Dr. Edrick Kins, [place]                                        [place], [place] [address]                Office [phone]  Fax [phone]

## 2018-06-17 ENCOUNTER — Encounter: Payer: Self-pay | Admitting: Podiatry

## 2018-06-17 ENCOUNTER — Ambulatory Visit: Payer: BLUE CROSS/BLUE SHIELD | Admitting: Podiatry

## 2018-06-17 DIAGNOSIS — M659 Synovitis and tenosynovitis, unspecified: Secondary | ICD-10-CM | POA: Diagnosis not present

## 2018-06-17 DIAGNOSIS — M722 Plantar fascial fibromatosis: Secondary | ICD-10-CM | POA: Diagnosis not present

## 2018-06-17 DIAGNOSIS — R600 Localized edema: Secondary | ICD-10-CM

## 2018-06-17 MED ORDER — NAPROXEN 500 MG PO TABS: 500.0000 mg | ORAL_TABLET | Freq: Two times a day (BID) | ORAL | 1 refills | Status: DC

## 2018-06-19 NOTE — Progress Notes (Signed)
   Subjective:  57 year old female with PMHx of DM presenting today for follow up evaluation of left ankle synovitis and BLE edema. She reports continued significant pain to the lateral aspects of bilateral feet. She states the pain has not improved since her previous visit. There are no modifying factors noted. Patient is here for further evaluation and treatment.   Past Medical History:  Diagnosis Date  . Atrial fibrillation with RVR ([place])   . Complication of anesthesia    had epidural for C-section and epidural did not take until after surgery  . Depression   . Diabetes mellitus without complication ([place])   . GERD (gastroesophageal reflux disease)   . Heart murmur   . Hyperlipidemia   . Hypertension   . Obesity   . Sleep apnea   . Temporal arteritis ([place])     Objective / Physical Exam:  General:  The patient is alert and oriented x3 in no acute distress. Dermatology:  Skin is warm, dry and supple bilateral lower extremities. Negative for open lesions or macerations. Vascular:  Palpable pedal pulses bilaterally. Capillary refill within normal limits. Extensive edema noted to the bilateral lower extremities with hemosiderin discoloration around the ankle goiter area bilaterally.   Neurological:  Epicritic and protective threshold diminished bilaterally.  Musculoskeletal Exam:  Pain on palpation to the anterior lateral medial aspects of the patient's left ankle as well as to the plantar aspect of the bilateral heels along the plantar fascia. Range of motion within normal limits to all pedal and ankle joints bilateral. Muscle strength 5/5 in all groups bilateral.   Assessment: 1. Left ankle synovitis  2. BLE edema  3. Plantar fasciitis bilateral   Plan of Care:  1. Patient was evaluated.  2. Prescription for Naprosyn 500 mg BID provided to patient.  3. Compression anklets dispensed bilaterally.  4. Authorization requested for custom molded orthotics.  5. Return to clinic in  6 weeks.   Works at [employer] 10+ hour shifts.   Edrick Kins, DPM [facility]  Dr. Edrick Kins, [place]                                        [place], [place] [address]                Office [phone]  Fax [phone]

## 2018-06-28 DIAGNOSIS — E113513 Type 2 diabetes mellitus with proliferative diabetic retinopathy with macular edema, bilateral: Secondary | ICD-10-CM | POA: Diagnosis not present

## 2018-07-03 ENCOUNTER — Ambulatory Visit: Payer: Self-pay | Admitting: Nurse Practitioner

## 2018-07-07 NOTE — Progress Notes (Addendum)
Cardiology Office Note Date:  07/07/2018  Patient ID:  Angela [name], Angela [name] 02-09-1961, MRN [id number] PCP:  Cari Caraway, MD  Cardiologist:  Dr. Lovena Le   Chief Complaint:  Post hospital f/u  History of Present Illness: Angela [name] is a 57 y.o. female with history of M, HTN, HLD, DM, depression, obesity, GERD, venous stasis, temporal arteritis  She was seen by Dr. Lovena Le on 02/07/2016 in the office for pre-operative evaluation and abnormal EKG with inferolateral TWI. She was is planning to undergo a temporal artery biopsy on 7/20. Dr. Lovena Le recommended Lexiscan nuclear stress test. She presented as an outpatient for this on 02/08/2016 - when hooked up to the monitor she was noted to be in afib RVR with HR 190s-200s, preserved BP. She reported feeling crummy that morning, overall fatigued particularly with exertion. She can't really pinpoint a time of when this started. She reports intermittent dyspnea which is chronic. She denies any chest pain, nausea, vomiting, orthopnea, syncope or awareness of elevated HR. She denies any history of stroke, ministroke, or any bleeding issues. Nuc med did not have the capacity to treat and monitor the patient down long-term so she was brought to the ER for further evaluation.   In the ED she was placed in a room and started on IV diltiazem, given 2 10mg  boluses which reduced her heart rate into the 120-130s. She was also started on metoprolol 25mg  BID, along with heparin. Telemetry showed that she converted to NSR at 1800 later that afternoon, and she was transitioned to PO diltiazem at 60mg  q6h. Initial trop was 0.83 with downward trend to 0.40, the decision was made to proceed with a LHC. She underwent cath with Dr. Burt Knack on 02/09/2016 which showed normal coronaries. Also had a 2D echo that showed EF 60-65% with NWMA. After cath she was started on Xarelto given her CHA2DS2-VASc Score of 3. She was discharged 02/10/16, cleared for her temporal artery bx with  instructions to hold the xarelto 48hours prior..   She was seen by myself 02/22/16 with reports increased LE swelling and aching LE especially RLE since her diuretic was stopped, she states this is a problem for her with known venous stasis, she reports swelling and discoloration ans chronic, though more swollen then normal.  Her diltiazem was stopped, her BB up-titrated and her Maxide resumed.  03/13/16 visit she reported to me she stopped the diltiazem did not make any difference in her itching, in-fact seemed to steadily increase to the point that she could not tolearate it, feels like it made her legs ache, feel weak, thought should would need a cane, and once had a fall (with no trauma), and stopped the metoprolol and xarelto with complete resolution of her symptoms.  She was started on Eliquis for a/c and comes in today to be seen in f/u after this change.  Last follow up with our service was with me in Sept 2017, she was doing well, no side effects or reactions with the Eliquis.  No bleeding or signs of bleeding. No CP, no SOB, no PND or orthopnea symptoms, no dizziness, near syncope or syncope. She is not yet scheduled for her biopsy.  She remained on prednisone for that, and reports the biopsy is on hold indefinitely at least for the short term, though mentions when they try to wean of she starts to get symptoms again.  She had a hospitalization with recurrent Afib/RVR, demand ischemia (Trop I <0.03, 0.07, 0.05), acute/chronic diastolic CHF, treated  with dilt gtt and had spontaneous conversion to SR, patient declined rx for BB/CCB given rare AF and intolerance to both in the past.  (leg aching/swelling).  Echo noted LVEF 65-70%, mild LVH.  Discharged 04/25/18.  Since her hospital stay she has felt a few very fleeting slight palpitations, nothing bothersome.  She feels like she has AFib that becomes symptomatic with chest heaviness about 2-3x a year.  No CP otherwise.   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  She has a hadr time around the holidays since both of her parents have passed away now.  No dizziness, no near syncope or syncope.  She has her sleep apnea machine (she is not sure if it is CPAP or BIPAP, but isn't using it.  She states she hasn't even plugged it in. She is taking her eliquis, no bleeding or signs of bleeding  Past Medical History:  Diagnosis Date  . Atrial fibrillation with RVR ([place])   . Complication of anesthesia    had epidural for C-section and epidural did not take until after surgery  . Depression   . Diabetes mellitus without complication ([place])   . GERD (gastroesophageal reflux disease)   . Heart murmur   . Hyperlipidemia   . Hypertension   . Obesity   . Sleep apnea   . Temporal arteritis [facility])     Past Surgical History:  Procedure Laterality Date  . BREAST SURGERY     bilateral breast reduction  . CARDIAC CATHETERIZATION N/A 02/09/2016   Procedure: Left Heart Cath and Coronary Angiography;  Surgeon: Sherren Mocha, MD;  Location: [facility];  Service: Cardiovascular;  Laterality: N/A;  . CESAREAN SECTION    . COLONOSCOPY    . TUBAL LIGATION      Current Outpatient Medications  Medication Sig Dispense Refill  . apixaban (ELIQUIS) 5 MG TABS tablet Take 1 tablet (5 mg total) by mouth 2 (two) times daily. 60 tablet 5  . DULoxetine (CYMBALTA) 60 MG capsule Take 60 mg by mouth daily.    Marland Kitchen esomeprazole (NEXIUM) 40 MG capsule Take 40 mg by mouth daily.    . Insulin Glargine (BASAGLAR KWIKPEN) 100 UNIT/ML SOPN Inject 50 Units into the skin daily. Takes 30 units every night but if she takes prednisone she takes twice daily according to sliding scale    . lisinopril (PRINIVIL,ZESTRIL) 40 MG tablet Take 40 mg by mouth daily.  3  . metFORMIN (GLUCOPHAGE-XR) 500  MG 24 hr tablet Take 500 mg by mouth daily.  3  . naproxen (NAPROSYN) 500 MG tablet Take 1 tablet (500 mg total) by mouth 2 (two) times daily with a meal. 60 tablet 1   No current facility-administered medications for this visit.     Allergies:   E-mycin [erythromycin]; Avocado; Banana; Glucophage [metformin hcl]; Kiwi extract; Mango flavor; Metoprolol; Xarelto [rivaroxaban]; Levemir [insulin detemir]; and Lexapro [escitalopram oxalate]   Social History:  The patient  reports that she has never smoked. She has never used smokeless tobacco. She reports current alcohol use. She reports that she does not use drugs.   Family History:  The patient's family history includes Alzheimer's disease in her unknown relative; CAD in her unknown relative; Diabetes in her unknown relative; Kidney failure in her father.  ROS:  Please see the history of present  illness.   All other systems are reviewed and otherwise negative   PHYSICAL EXAM:   VS:  There were no vitals taken for this visit. BMI: There is no height or weight on file to calculate BMI. Well nourished, obese, well developed, in no acute distress  HEENT: normocephalic, atraumatic  Neck: no JVD, carotid bruits or masses Cardiac:  RRR; no significant murmurs,  no rubs, or gallops Lungs:  CTA b/l, no wheezing, rhonchi or rales  Abd: soft, nontender, obese MS: no deformity or atrophy Ext: chronic looking skin changes b/l,  LE edema, per the patient at her baseline Skin: warm and dry, no rash Neuro:  No gross deficits appreciated Psych: euthymic mood, full affect   EKG:  ER EKG is reviewed  04/25/18: TTE Study Conclusions - Left ventricle: The cavity size was normal. Wall thickness was   increased in a pattern of mild LVH. Systolic function was   vigorous. The estimated ejection fraction was in the range of 65%   to 70%. - Mitral valve: There was mild regurgitation.  02/09/16: Echocardiogram Study Conclusions - Left ventricle: The  cavity size was normal. Wall thickness was  increased in a pattern of moderate LVH. Systolic function was  normal. The estimated ejection fraction was in the range of 60%  to 65%. Wall motion was normal; there were no regional wall  motion abnormalities. Doppler parameters are consistent with  abnormal left ventricular relaxation (grade 1 diastolic  dysfunction). The E/e&' ratio is >15, suggesting elevated LV  filling pressure. - Aortic valve: Trileaflet. Sclerosis without stenosis. There was  no regurgitation. - Mitral valve: Calcified annulus. Mildly thickened leaflets .  There was trivial regurgitation. - Left atrium: The atrium was mildly dilated. - Tricuspid valve: There was trivial regurgitation. - Pulmonary arteries: PA peak pressure: 23 mm Hg (S). - Inferior vena cava: The vessel was normal in size. The  respirophasic diameter changes were in the normal range (>= 50%),  consistent with normal central venous pressure. Impressions: - LVEF 60-65%, moderate LVH, normal wall motion, diastolic  dysfunction with elevated LV filling pressure, trivial MR, mildly  dilated LA, trivial TR, RVSP 23 mmHg, normal IVC.  LHC: 02/09/2016    Conclusion    1. Angiographically normal coronary arteries 2. Elevated LVEDP  Recommend: Suspect demand ischemia from AFib with RVR. Start Xarelto tonight.     Recent Labs: 04/24/2018: B Natriuretic Peptide 199.1; BUN 17; Creatinine, Ser 0.85; Hemoglobin 10.8; Magnesium 1.8; Platelets 258; Potassium 4.4; Sodium 137; TSH 1.270  No results found for requested labs within last 8760 hours.   CrCl cannot be calculated (Patient's most recent lab result is older than the maximum 21 days allowed.).   Wt Readings from Last 3 Encounters:  04/25/18 260 lb 12.9 oz (118.3 kg)  09/24/16 278 lb (126.1 kg)  06/13/16 288 lb (130.6 kg)     Other studies reviewed: Additional studies/records reviewed today include: summarized above  ASSESSMENT  AND PLAN:  1. PAfib     CHA2DS2Vasc is at least 3, self stopped the Xarelto secondary to itching (resolved) >> Eliquis      She feels like her AFib burden is OK for the most part, has difficulty with most medicines.      I will have her f/u next with the AFib clinic to get established with them in the next 3 months      Strongly urged her to use her CPAP/BIPAP for her apnea      RRR on exam  today  2. HTN     Initially 186/90     She was very emotional had been thinking about the holiday with out her parnts and other family being away     We discussed counseling, d/w her PMD if she felt needed help with this, she did not feel like that was necessary, suggested perhaps reaching out to her pastor as well, finding volunteer work she may feel able to do through her church or local shelters often needing more help in the colder months/holidays.  She was very interested in this.       A recheck on her BP at the end of our visit was 158/80       Urged to get started with her CPAP/BIPAP for her apnea  3. Venous stasis with edema/chronic skin changes    At her baseline per the patient    Encouraged support stockings especially at work on her feet all day  6. Diastolic dysfunction     Does not appear acutely fluid OL  7. Abn CXR     Discharge summary rec out patient cxr to f/u on diffuse reticulonodular interstitial opacities     She sees her PMD Friday this week, recommend she f/u this there   Disposition: AFib clinic in 34mo, us/there sooner if needed.  Current medicines are reviewed at length with the patient today.  The patient did not have any concerns regarding medicines.  Haywood Lasso, PA-C 07/07/2018 5:46 PM     [address] [place] [address] [phone] (office)  [phone] (fax)

## 2018-07-09 ENCOUNTER — Ambulatory Visit (INDEPENDENT_AMBULATORY_CARE_PROVIDER_SITE_OTHER): Payer: 59 | Admitting: Physician Assistant

## 2018-07-09 VITALS — BP 186/90 | HR 88 | Ht 62.0 in | Wt 273.0 lb

## 2018-07-09 DIAGNOSIS — I480 Paroxysmal atrial fibrillation: Secondary | ICD-10-CM

## 2018-07-09 DIAGNOSIS — I878 Other specified disorders of veins: Secondary | ICD-10-CM

## 2018-07-09 DIAGNOSIS — I10 Essential (primary) hypertension: Secondary | ICD-10-CM | POA: Diagnosis not present

## 2018-07-09 NOTE — Patient Instructions (Signed)
Medication Instructions:  Your physician recommends that you continue on your current medications as directed. Please refer to the Current Medication list given to you today.  If you need a refill on your cardiac medications before your next appointment, please call your pharmacy.   Lab work: NONE ORDERED  TODAY   If you have labs (blood work) drawn today and your tests are completely normal, you will receive your results only by: Marland Kitchen MyChart Message (if you have MyChart) OR . A paper copy in the mail If you have any lab test that is abnormal or we need to change your treatment, we will call you to review the results.  Testing/Procedures: NONE ORDERED  TODAY   Follow-Up:  WITH AFIB CLINIC IN 3 MONTHS    Any Other Special Instructions Will Be Listed Below (If Applicable).  PLEASE START USING SLEEP APNEA  MACHINE

## 2018-07-12 DIAGNOSIS — E1121 Type 2 diabetes mellitus with diabetic nephropathy: Secondary | ICD-10-CM | POA: Diagnosis not present

## 2018-07-12 DIAGNOSIS — I480 Paroxysmal atrial fibrillation: Secondary | ICD-10-CM | POA: Diagnosis not present

## 2018-07-12 DIAGNOSIS — R809 Proteinuria, unspecified: Secondary | ICD-10-CM | POA: Diagnosis not present

## 2018-07-12 DIAGNOSIS — I5032 Chronic diastolic (congestive) heart failure: Secondary | ICD-10-CM | POA: Diagnosis not present

## 2018-07-12 DIAGNOSIS — E559 Vitamin D deficiency, unspecified: Secondary | ICD-10-CM | POA: Diagnosis not present

## 2018-07-29 ENCOUNTER — Ambulatory Visit: Payer: BLUE CROSS/BLUE SHIELD | Admitting: Podiatry

## 2018-08-16 DIAGNOSIS — H3582 Retinal ischemia: Secondary | ICD-10-CM | POA: Diagnosis not present

## 2018-08-16 DIAGNOSIS — E113513 Type 2 diabetes mellitus with proliferative diabetic retinopathy with macular edema, bilateral: Secondary | ICD-10-CM | POA: Diagnosis not present

## 2018-08-16 DIAGNOSIS — H2513 Age-related nuclear cataract, bilateral: Secondary | ICD-10-CM | POA: Diagnosis not present

## 2018-08-19 ENCOUNTER — Other Ambulatory Visit: Payer: Self-pay | Admitting: Podiatry

## 2018-09-11 ENCOUNTER — Other Ambulatory Visit: Payer: Self-pay | Admitting: Podiatry

## 2018-09-13 DIAGNOSIS — H40013 Open angle with borderline findings, low risk, bilateral: Secondary | ICD-10-CM | POA: Diagnosis not present

## 2018-10-11 ENCOUNTER — Ambulatory Visit (HOSPITAL_COMMUNITY): Payer: 59 | Admitting: Physician Assistant

## 2018-11-08 DIAGNOSIS — E1121 Type 2 diabetes mellitus with diabetic nephropathy: Secondary | ICD-10-CM | POA: Diagnosis not present

## 2018-11-08 DIAGNOSIS — I10 Essential (primary) hypertension: Secondary | ICD-10-CM | POA: Diagnosis not present

## 2018-11-08 DIAGNOSIS — R809 Proteinuria, unspecified: Secondary | ICD-10-CM | POA: Diagnosis not present

## 2018-11-14 DIAGNOSIS — E559 Vitamin D deficiency, unspecified: Secondary | ICD-10-CM | POA: Diagnosis not present

## 2018-11-14 DIAGNOSIS — E1121 Type 2 diabetes mellitus with diabetic nephropathy: Secondary | ICD-10-CM | POA: Diagnosis not present

## 2018-11-29 ENCOUNTER — Ambulatory Visit (HOSPITAL_COMMUNITY): Payer: 59 | Admitting: Physician Assistant

## 2019-04-21 ENCOUNTER — Telehealth (HOSPITAL_COMMUNITY): Payer: Self-pay | Admitting: Physician Assistant

## 2019-04-21 NOTE — Telephone Encounter (Signed)
Patient called stating she had received her recall letter to call and make an appt for October.  Pt states at this time she does not have insurance and is not financially able to come for an appt so she does not want to schedule any appts.  She will call back if she is able to get a job and insurance. I offered to put her in touch with our CSW to try to assist her so she could come for an appt but she declined.

## 2019-07-16 ENCOUNTER — Emergency Department (HOSPITAL_COMMUNITY): Payer: 59

## 2019-07-16 ENCOUNTER — Encounter (HOSPITAL_COMMUNITY): Payer: Self-pay

## 2019-07-16 ENCOUNTER — Other Ambulatory Visit: Payer: Self-pay

## 2019-07-16 ENCOUNTER — Emergency Department (HOSPITAL_COMMUNITY)
Admission: EM | Admit: 2019-07-16 | Discharge: 2019-07-16 | Disposition: A | Discharge status: Home / Self Care | Payer: 59 | Attending: Emergency Medicine | Admitting: Emergency Medicine

## 2019-07-16 DIAGNOSIS — R5383 Other fatigue: Secondary | ICD-10-CM | POA: Diagnosis not present

## 2019-07-16 DIAGNOSIS — Z20828 Contact with and (suspected) exposure to other viral communicable diseases: Secondary | ICD-10-CM | POA: Insufficient documentation

## 2019-07-16 DIAGNOSIS — I110 Hypertensive heart disease with heart failure: Secondary | ICD-10-CM | POA: Diagnosis not present

## 2019-07-16 DIAGNOSIS — I5031 Acute diastolic (congestive) heart failure: Secondary | ICD-10-CM | POA: Insufficient documentation

## 2019-07-16 DIAGNOSIS — R0789 Other chest pain: Secondary | ICD-10-CM | POA: Diagnosis present

## 2019-07-16 DIAGNOSIS — R197 Diarrhea, unspecified: Secondary | ICD-10-CM | POA: Insufficient documentation

## 2019-07-16 DIAGNOSIS — Z7901 Long term (current) use of anticoagulants: Secondary | ICD-10-CM | POA: Insufficient documentation

## 2019-07-16 DIAGNOSIS — I4891 Unspecified atrial fibrillation: Secondary | ICD-10-CM

## 2019-07-16 DIAGNOSIS — E119 Type 2 diabetes mellitus without complications: Secondary | ICD-10-CM | POA: Insufficient documentation

## 2019-07-16 DIAGNOSIS — B349 Viral infection, unspecified: Secondary | ICD-10-CM | POA: Diagnosis not present

## 2019-07-16 LAB — HEPATIC FUNCTION PANEL
ALT: 24 U/L (ref 0–44)
AST: 21 U/L (ref 15–41)
Albumin: 2.9 g/dL — ABNORMAL LOW (ref 3.5–5.0)
Alkaline Phosphatase: 72 U/L (ref 38–126)
Bilirubin, Direct: 0.1 mg/dL (ref 0.0–0.2)
Indirect Bilirubin: 0.6 mg/dL (ref 0.3–0.9)
Total Bilirubin: 0.7 mg/dL (ref 0.3–1.2)
Total Protein: 7.1 g/dL (ref 6.5–8.1)

## 2019-07-16 LAB — BASIC METABOLIC PANEL
Anion gap: 13 (ref 5–15)
BUN: 18 mg/dL (ref 6–20)
CO2: 25 mmol/L (ref 22–32)
Calcium: 8.9 mg/dL (ref 8.9–10.3)
Chloride: 102 mmol/L (ref 98–111)
Creatinine, Ser: 1.05 mg/dL — ABNORMAL HIGH (ref 0.44–1.00)
GFR calc Af Amer: >60 mL/min (ref >60)
GFR calc non Af Amer: 58 mL/min — ABNORMAL LOW (ref >60)
Glucose, Bld: 375 mg/dL — ABNORMAL HIGH (ref 70–99)
Potassium: 3.6 mmol/L (ref 3.5–5.1)
Sodium: 140 mmol/L (ref 135–145)

## 2019-07-16 LAB — POC SARS CORONAVIRUS 2 AG -  ED: SARS Coronavirus 2 Ag: NEGATIVE

## 2019-07-16 LAB — CBG MONITORING, ED: Glucose-Capillary: 285 mg/dL — ABNORMAL HIGH (ref 70–99)

## 2019-07-16 LAB — CBC
HCT: 34.3 % — ABNORMAL LOW (ref 36.0–46.0)
Hemoglobin: 10.7 g/dL — ABNORMAL LOW (ref 12.0–15.0)
MCH: 27.1 pg (ref 26.0–34.0)
MCHC: 31.2 g/dL (ref 30.0–36.0)
MCV: 86.8 fL (ref 80.0–100.0)
Platelets: 231 10*3/uL (ref 150–400)
RBC: 3.95 MIL/uL (ref 3.87–5.11)
RDW: 13.2 % (ref 11.5–15.5)
WBC: 4.4 10*3/uL (ref 4.0–10.5)
nRBC: 0 % (ref 0.0–0.2)

## 2019-07-16 LAB — RESPIRATORY PANEL BY RT PCR (FLU A&B, COVID)
Influenza A by PCR: NEGATIVE
Influenza B by PCR: NEGATIVE
SARS Coronavirus 2 by RT PCR: NEGATIVE

## 2019-07-16 LAB — TROPONIN I (HIGH SENSITIVITY): Troponin I (High Sensitivity): 12 ng/L (ref <18)

## 2019-07-16 MED ORDER — DILTIAZEM HCL-DEXTROSE 125-5 MG/125ML-% IV SOLN (PREMIX)
5.0000 mg/h | INTRAVENOUS | Status: DC
  Administered 2019-07-16: 5 mg/h via INTRAVENOUS
  Filled 2019-07-16: qty 125

## 2019-07-16 MED ORDER — DILTIAZEM LOAD VIA INFUSION
10.0000 mg | Freq: Once | INTRAVENOUS | Status: AC
  Administered 2019-07-16: 10 mg via INTRAVENOUS
  Filled 2019-07-16: qty 10

## 2019-07-16 MED ORDER — SODIUM CHLORIDE 0.9 % IV BOLUS
500.0000 mL | Freq: Once | INTRAVENOUS | Status: AC
  Administered 2019-07-16: 500 mL via INTRAVENOUS

## 2019-07-16 MED ORDER — ETOMIDATE 2 MG/ML IV SOLN
10.0000 mg | Freq: Once | INTRAVENOUS | Status: AC
  Administered 2019-07-16: 10 mg via INTRAVENOUS
  Filled 2019-07-16: qty 10

## 2019-07-16 MED ORDER — FENTANYL CITRATE (PF) 100 MCG/2ML IJ SOLN
50.0000 ug | Freq: Once | INTRAMUSCULAR | Status: AC
  Administered 2019-07-16: 50 ug via INTRAVENOUS
  Filled 2019-07-16: qty 2

## 2019-07-16 NOTE — ED Notes (Signed)
The lowest pt's Sa02 went when walking was 97%

## 2019-07-16 NOTE — ED Triage Notes (Signed)
Pt having generalized weakness and chest pain for the past couple days, hx of a fib. Pt in a fib rvr 150s in triage. Pt alert and oriented.

## 2019-07-16 NOTE — ED Notes (Signed)
Myself, Dr. Alvino Chapel and Linton Rump, RT are present for moderate sedation. Time out performed.

## 2019-07-16 NOTE — Progress Notes (Signed)
RRT at bedside for the conscious sedation of synchronize cardioversion of A-fib. Goal was to maintain ventilation and optimal oxygenation via utilization of ETCO2 monitoring and oxygen therapy. Cardioversion was successful. MD at bedside throughout procedure. No complications noted.

## 2019-07-16 NOTE — Discharge Instructions (Signed)
Follow-up with your cardiologist or the A. fib clinic.  Try and keep yourself hydrated.

## 2019-07-16 NOTE — ED Provider Notes (Signed)
[facility] EMERGENCY DEPARTMENT Provider Note   CSN: [id number] Arrival date & time: 07/16/19  1551     History Chief Complaint  Patient presents with  . Chest Pain  . Weakness    Angela [name] is a 58 y.o. female.  HPI Patient presents with fatigue and shortness of breath.  States that she began to feel bad on Friday with today being Wednesday.  States that she had muscle aches and some generalized weakness.  Began to have some diarrhea also.  Some shortness of breath without real cough.  States she had a outpatient Covid done today but the results are not back yet.  More short of breath and now feels that she is in atrial fibrillation.  States she can feel her heart going fast.  States that started this morning.  She is on Eliquis and has been taking the pills.  States that she has not been in A. fib for about a year.  No known sick contacts.  Minimal cough.    Past Medical History:  Diagnosis Date  . Atrial fibrillation with RVR ([place])   . Complication of anesthesia    had epidural for C-section and epidural did not take until after surgery  . Depression   . Diabetes mellitus without complication ([place])   . GERD (gastroesophageal reflux disease)   . Heart murmur   . Hyperlipidemia   . Hypertension   . Obesity   . Sleep apnea   . Temporal arteritis [facility]     Patient Active Problem List   Diagnosis Date Noted  . Acute diastolic CHF (congestive heart failure) ([place]) 04/24/2018  . OSA (obstructive sleep apnea) 04/24/2018  . Elevated troponin level   . New onset atrial fibrillation ([place]) 02/08/2016  . Temporal arteritis ([place] 02/08/2016  . Essential hypertension 02/08/2016  . Hypertensive heart disease 02/08/2016  . Hyperlipidemia 02/08/2016  . Morbid obesity ([place]) 02/08/2016  . Diabetes mellitus type 2, uncontrolled ([place]) 02/08/2016  . Atrial fibrillation with RVR ([place]) 02/08/2016    Past Surgical History:  Procedure Laterality Date  . BREAST  SURGERY     bilateral breast reduction  . CARDIAC CATHETERIZATION N/A 02/09/2016   Procedure: Left Heart Cath and Coronary Angiography;  Surgeon: Sherren Mocha, MD;  Location: [place] CV LAB;  Service: Cardiovascular;  Laterality: N/A;  . CESAREAN SECTION    . COLONOSCOPY    . TUBAL LIGATION       OB History   No obstetric history on file.     Family History  Problem Relation Age of Onset  . Kidney failure Father   . CAD Other        Several aunts had MIs  . Alzheimer's disease Other   . Diabetes Other     Social History   Tobacco Use  . Smoking status: Never Smoker  . Smokeless tobacco: Never Used  Substance Use Topics  . Alcohol use: Yes    Alcohol/week: 0.0 standard drinks    Comment: rare  . Drug use: No    Home Medications Prior to Admission medications   Medication Sig Start Date End Date Taking? Authorizing Provider  apixaban (ELIQUIS) 5 MG TABS tablet Take 1 tablet (5 mg total) by mouth 2 (two) times daily. 03/13/16  Yes Baldwin [place], PA-C  chlorpheniramine-HYDROcodone (TUSSIONEX) 10-8 MG/5ML SUER Take 5 mLs by mouth 2 (two) times daily as needed for cough. 07/14/19  Yes [provider]  dorzolamide-timolol (COSOPT) 22.3-6.8 MG/ML ophthalmic solution  Place 1 drop into both eyes at bedtime. 04/17/19  Yes [provider]  esomeprazole (NEXIUM) 20 MG capsule Take 40 mg by mouth daily in the afternoon.    Yes [provider]  glimepiride (AMARYL) 4 MG tablet Take 1 tablet by mouth daily with breakfast.    Yes [provider]  lisinopril (PRINIVIL,ZESTRIL) 40 MG tablet Take 40 mg by mouth daily. 03/22/16  Yes [provider]  pravastatin (PRAVACHOL) 40 MG tablet Take 40 mg by mouth daily with supper. 06/21/19  Yes [provider]  triamterene-hydrochlorothiazide (MAXZIDE-25) 37.5-25 MG tablet Take 1 tablet by mouth daily. 06/29/19  Yes [provider]  Insulin Glargine (BASAGLAR KWIKPEN) 100 UNIT/ML  SOPN Inject 50 Units into the skin daily. Takes 30 units every night but if she takes prednisone she takes twice daily according to sliding scale    [provider]  ipratropium (ATROVENT) 0.06 % nasal spray Place 2 sprays into both nostrils 2 (two) times daily. 07/14/19   [provider]    Allergies    E-mycin [erythromycin], Avocado, Banana, Glucophage [metformin hcl], Kiwi extract, Mango flavor, Metoprolol, Xarelto [rivaroxaban], Levemir [insulin detemir], and Lexapro [escitalopram oxalate]  Review of Systems   Review of Systems  Constitutional: Positive for fatigue. Negative for appetite change.  HENT: Positive for congestion.   Respiratory: Positive for shortness of breath.   Cardiovascular: Positive for chest pain and palpitations.  Gastrointestinal: Negative for abdominal pain.  Genitourinary: Negative for flank pain.  Musculoskeletal: Positive for myalgias.  Skin: Negative for rash.  Neurological: Positive for weakness.    Physical Exam Updated Vital Signs BP (!) 153/82   Pulse 79   Temp 98 F (36.7 C) (Oral)   Resp 18   SpO2 97%   Physical Exam Vitals and nursing note reviewed.  HENT:     Head: Atraumatic.  Cardiovascular:     Rate and Rhythm: Tachycardia present. Rhythm irregular.  Musculoskeletal:     Cervical back: Neck supple.     Comments: Mild edema bilateral lower extremities.  Skin:    Capillary Refill: Capillary refill takes less than 2 seconds.  Neurological:     Mental Status: She is alert and oriented to person, place, and time.     ED Results / Procedures / Treatments   Labs (all labs ordered are listed, but only abnormal results are displayed) Labs Reviewed  BASIC METABOLIC PANEL - Abnormal; Notable for the following components:      Result Value   Glucose, Bld 375 (*)    Creatinine, Ser 1.05 (*)    GFR calc non Af Amer 58 (*)    All other components within normal limits  CBC - Abnormal; Notable for the following  components:   Hemoglobin 10.7 (*)    HCT 34.3 (*)    All other components within normal limits  HEPATIC FUNCTION PANEL - Abnormal; Notable for the following components:   Albumin 2.9 (*)    All other components within normal limits  CBG MONITORING, ED - Abnormal; Notable for the following components:   Glucose-Capillary 285 (*)    All other components within normal limits  RESPIRATORY PANEL BY RT PCR (FLU A&B, COVID)  URINALYSIS, ROUTINE W REFLEX MICROSCOPIC  POC SARS CORONAVIRUS 2 AG -  ED  TROPONIN I (HIGH SENSITIVITY)    EKG EKG Interpretation  Date/Time:  Wednesday July 16 2019 21:20:07 EST Ventricular Rate:  73 PR Interval:    QRS Duration: 86 QT Interval:  397  QTC Calculation: 438 R Axis:   62 Text Interpretation: Sinus arrhythmia Borderline repolarization abnormality afib with RVR has resolved Confirmed by Davonna Belling [phone]) on 07/16/2019 9:26:16 PM   Radiology DG Chest Portable 1 View  Result Date: 07/16/2019 CLINICAL DATA:  Generalized weakness and chest pain for couple days, history atrial fibrillation, diabetes mellitus, hypertension EXAM: PORTABLE CHEST 1 VIEW COMPARISON:  Portable exam 1704 hours compared to 07/12/2018 FINDINGS: Enlargement of cardiac silhouette. Mediastinal contours and pulmonary vascularity normal. Persistent reticulonodular infiltrates in mid to lower lungs, appear chronic and unchanged since 2019. No definite acute infiltrate, pleural effusion or pneumothorax. Bones demineralized. IMPRESSION: Enlargement of cardiac silhouette. Chronic reticulonodular interstitial prominence without acute infiltrate. Electronically Signed   By: Lavonia Dana M.D.   On: 07/16/2019 17:33    Procedures Sedation procedure  Date/Time: 07/16/2019 9:51 PM Performed by: Davonna Belling, MD Authorized by: Davonna Belling, MD   Consent:    Consent obtained:  Written   Consent given by:  Patient   Risks discussed:  Allergic reaction, dysrhythmia,  nausea, inadequate sedation, vomiting, respiratory compromise necessitating ventilatory assistance and intubation and prolonged hypoxia resulting in organ damage Universal protocol:    Immediately prior to procedure a time out was called: yes     Patient identity confirmation method:  Arm band and verbally with patient Indications:    Procedure performed:  Cardioversion   Procedure necessitating sedation performed by:  Physician performing sedation Pre-sedation assessment:    Time since last food or drink:  4   ASA classification: class 3 - patient with severe systemic disease     Neck mobility: normal     Mouth opening:  2 finger widths   Thyromental distance:  3 finger widths   Mallampati score:  III - soft palate, base of uvula visible   Pre-sedation assessments completed and reviewed: airway patency, cardiovascular function, hydration status, mental status and pain level     Pre-sedation assessments completed and reviewed: pre-procedure nausea and vomiting status not reviewed   Immediate pre-procedure details:    Reassessment: Patient reassessed immediately prior to procedure     Reviewed: vital signs, relevant labs/tests and NPO status     Verified: bag valve mask available, emergency equipment available, intubation equipment available, IV patency confirmed and oxygen available   Procedure details (see MAR for exact dosages):    Preoxygenation:  Nasal cannula   Sedation:  Etomidate   Intended level of sedation: deep   Analgesia:  Fentanyl   Intra-procedure monitoring:  Blood pressure monitoring, cardiac monitor, continuous pulse oximetry, continuous capnometry, frequent LOC assessments and frequent vital sign checks   Intra-procedure events: none     Total Provider sedation time (minutes):  5 Post-procedure details:    Attendance: Constant attendance by certified staff until patient recovered     Recovery: Patient returned to pre-procedure baseline     Patient is stable for  discharge or admission: yes     Patient tolerance:  Tolerated well, no immediate complications .Cardioversion  Date/Time: 07/16/2019 9:52 PM Performed by: Davonna Belling, MD Authorized by: Davonna Belling, MD   Consent:    Consent obtained:  Written   Consent given by:  Patient   Risks discussed:  Cutaneous burn, death, induced arrhythmia and pain   Alternatives discussed:  No treatment, rate-control medication, alternative treatment and anti-coagulation medication Pre-procedure details:    Cardioversion basis:  Elective   Rhythm:  Atrial fibrillation   Electrode placement:  Anterior-posterior Patient sedated: Yes. Refer to sedation procedure  documentation for details of sedation.  Attempt one:    Cardioversion mode:  Synchronous   Waveform:  Biphasic   Shock (Joules):  150   Shock outcome:  No change in rhythm Attempt two:    Cardioversion mode:  Synchronous   Waveform:  Biphasic   Shock (Joules):  200   Shock outcome:  Conversion to normal sinus rhythm Post-procedure details:    Patient status:  Alert   Patient tolerance of procedure:  Tolerated well, no immediate complications   (including critical care time)  Medications Ordered in ED Medications  diltiazem (CARDIZEM) 1 mg/mL load via infusion 10 mg (10 mg Intravenous Bolus from Bag 07/16/19 1648)    And  diltiazem (CARDIZEM) 125 mg in dextrose 5% 125 mL (1 mg/mL) infusion (0 mg/hr Intravenous Stopped 07/16/19 1956)  sodium chloride 0.9 % bolus 500 mL (0 mLs Intravenous Stopped 07/16/19 1945)  fentaNYL (SUBLIMAZE) injection 50 mcg (50 mcg Intravenous Given 07/16/19 1938)  etomidate (AMIDATE) injection 10 mg (10 mg Intravenous Given 07/16/19 1949)    ED Course  I have reviewed the triage vital signs and the nursing notes.  Pertinent labs & imaging results that were available during my care of the patient were reviewed by me and considered in my medical decision making (see chart for details).    MDM  Rules/Calculators/A&P                      Patient with URI type symptoms and fatigue.  Negative Covid test here.  X-ray reassuring.  However was found to have A. fib with RVR.  History of same and is on anticoagulation.  CHA2DS2-VASc score of 3.  Cardioverted in the ER.  Had onset of this morning, and is on anticoagulation.  Successfully cardioverted at 200 J.  Feels somewhat better.  Able to ambulate without hypoxia.  Discharge home with outpatient follow-up. Final Clinical Impression(s) / ED Diagnoses Final diagnoses:  Viral infection  Atrial fibrillation with rapid ventricular response [facility])    Rx / DC Orders ED Discharge Orders    None       Davonna Belling, MD 07/16/19 2153

## 2019-07-21 ENCOUNTER — Ambulatory Visit (HOSPITAL_COMMUNITY)
Admission: RE | Admit: 2019-07-21 | Discharge: 2019-07-21 | Disposition: A | Discharge status: Home / Self Care | Payer: 59 | Source: Ambulatory Visit | Attending: Nurse Practitioner | Admitting: Nurse Practitioner

## 2019-07-21 ENCOUNTER — Encounter (HOSPITAL_COMMUNITY): Payer: Self-pay | Admitting: Nurse Practitioner

## 2019-07-21 ENCOUNTER — Other Ambulatory Visit: Payer: Self-pay

## 2019-07-21 VITALS — BP 200/90 | HR 88 | Ht 62.0 in | Wt 264.4 lb

## 2019-07-21 DIAGNOSIS — Z79899 Other long term (current) drug therapy: Secondary | ICD-10-CM | POA: Insufficient documentation

## 2019-07-21 DIAGNOSIS — Z7901 Long term (current) use of anticoagulants: Secondary | ICD-10-CM | POA: Diagnosis not present

## 2019-07-21 DIAGNOSIS — Z7984 Long term (current) use of oral hypoglycemic drugs: Secondary | ICD-10-CM | POA: Diagnosis not present

## 2019-07-21 DIAGNOSIS — G4730 Sleep apnea, unspecified: Secondary | ICD-10-CM | POA: Insufficient documentation

## 2019-07-21 DIAGNOSIS — E785 Hyperlipidemia, unspecified: Secondary | ICD-10-CM | POA: Diagnosis not present

## 2019-07-21 DIAGNOSIS — K219 Gastro-esophageal reflux disease without esophagitis: Secondary | ICD-10-CM | POA: Diagnosis not present

## 2019-07-21 DIAGNOSIS — D6869 Other thrombophilia: Secondary | ICD-10-CM | POA: Diagnosis not present

## 2019-07-21 DIAGNOSIS — I10 Essential (primary) hypertension: Secondary | ICD-10-CM | POA: Diagnosis not present

## 2019-07-21 DIAGNOSIS — I480 Paroxysmal atrial fibrillation: Secondary | ICD-10-CM

## 2019-07-21 DIAGNOSIS — E119 Type 2 diabetes mellitus without complications: Secondary | ICD-10-CM | POA: Insufficient documentation

## 2019-07-21 DIAGNOSIS — Z888 Allergy status to other drugs, medicaments and biological substances status: Secondary | ICD-10-CM | POA: Insufficient documentation

## 2019-07-21 MED ORDER — DILTIAZEM HCL 30 MG PO TABS: ORAL_TABLET | ORAL | 3 refills | Status: DC

## 2019-07-21 NOTE — Patient Instructions (Signed)
Take Cardizem 30mg  as needed if heart rate is greater than 100 and the top number on blood pressure is greater than 100

## 2019-07-21 NOTE — Progress Notes (Signed)
Primary Care Physician: Cari Caraway, MD Referring Physician: [facility] ER f/u   Angela [name] is a 58 y.o. female with a h/o afib first dx in 2017. She was on BB/xarelto and had debilitating legs pains. BB was stopped and she was switched to eliquis. Her afib had been very quiet but she was seen in the ER last Wednesday for a GI virus and afib with rvr at 158 bpm. She was successfully cardioverted. She is now over the GI bug. Her covid test was negative.   She remains in SR in the clinic. Her BP is elevated on presentation and on repeat check.  She states she forget her BP pills last night. She also forgot to take her eliquis pm and it was stressed not to miss any more doses, especially after a cardioversion.CHA2DS2VASc of at least 4. She denies using tobacco, very rare alcohol, does have sleep apnea and  does not use her cpap.   Today, she denies symptoms of palpitations, chest pain, shortness of breath, orthopnea, PND, lower extremity edema, dizziness, presyncope, syncope, or neurologic sequela. The patient is tolerating medications without difficulties and is otherwise without complaint today.   Past Medical History:  Diagnosis Date  . Atrial fibrillation with RVR ([place])   . Complication of anesthesia    had epidural for C-section and epidural did not take until after surgery  . Depression   . Diabetes mellitus without complication ([place])   . GERD (gastroesophageal reflux disease)   . Heart murmur   . Hyperlipidemia   . Hypertension   . Obesity   . Sleep apnea   . Temporal arteritis [facility])    Past Surgical History:  Procedure Laterality Date  . BREAST SURGERY     bilateral breast reduction  . CARDIAC CATHETERIZATION N/A 02/09/2016   Procedure: Left Heart Cath and Coronary Angiography;  Surgeon: Sherren Mocha, MD;  Location: [place] CV LAB;  Service: Cardiovascular;  Laterality: N/A;  . CESAREAN SECTION    . COLONOSCOPY    . TUBAL LIGATION      Current Outpatient Medications   Medication Sig Dispense Refill  . apixaban (ELIQUIS) 5 MG TABS tablet Take 1 tablet (5 mg total) by mouth 2 (two) times daily. 60 tablet 5  . chlorpheniramine-HYDROcodone (TUSSIONEX) 10-8 MG/5ML SUER Take 5 mLs by mouth 2 (two) times daily as needed for cough.    . dorzolamide-timolol (COSOPT) 22.3-6.8 MG/ML ophthalmic solution Place 1 drop into both eyes at bedtime.    Marland Kitchen esomeprazole (NEXIUM) 20 MG capsule Take 40 mg by mouth daily in the afternoon.     Marland Kitchen glimepiride (AMARYL) 4 MG tablet Take 1 tablet by mouth daily with breakfast.     . lisinopril (PRINIVIL,ZESTRIL) 40 MG tablet Take 40 mg by mouth daily.  3  . pravastatin (PRAVACHOL) 40 MG tablet Take 40 mg by mouth daily with supper.    . triamterene-hydrochlorothiazide (MAXZIDE-25) 37.5-25 MG tablet Take 1 tablet by mouth daily.     No current facility-administered medications for this encounter.    Allergies  Allergen Reactions  . E-Mycin [Erythromycin] Nausea And Vomiting  . Avocado Diarrhea and Nausea And Vomiting  . Banana Diarrhea and Nausea And Vomiting  . Glucophage [Metformin Hcl] Diarrhea and Nausea And Vomiting  . Kiwi Extract Diarrhea and Nausea And Vomiting  . Mango Flavor Diarrhea  . Metoprolol     Unclear, patient was also started on Xarelto, developed severe itching and LE weakness, stopped both and symptoms resolved  .  Xarelto [Rivaroxaban]     Unclear, patient was also started on Metoprolol, developed severe itching/LE weakness, stopped both and her and symptoms resolved  . Levemir [Insulin Detemir] Rash  . Lexapro [Escitalopram Oxalate] Itching and Other (See Comments)    Numb lips    Social History   Socioeconomic History  . Marital status: Married    Spouse name: Not on file  . Number of children: Not on file  . Years of education: Not on file  . Highest education level: Not on file  Occupational History  . Not on file  Tobacco Use  . Smoking status: Never Smoker  . Smokeless tobacco: Never Used    Substance and Sexual Activity  . Alcohol use: Yes    Alcohol/week: 1.0 standard drinks    Types: 1 Cans of beer per week    Comment: rare  . Drug use: No  . Sexual activity: Not on file  Other Topics Concern  . Not on file  Social History Narrative  . Not on file   Social Determinants of Health   Financial Resource Strain:   . Difficulty of Paying Living Expenses: Not on file  Food Insecurity:   . Worried About Charity fundraiser in the Last Year: Not on file  . Ran Out of Food in the Last Year: Not on file  Transportation Needs:   . Lack of Transportation (Medical): Not on file  . Lack of Transportation (Non-Medical): Not on file  Physical Activity:   . Days of Exercise per Week: Not on file  . Minutes of Exercise per Session: Not on file  Stress:   . Feeling of Stress : Not on file  Social Connections:   . Frequency of Communication with Friends and Family: Not on file  . Frequency of Social Gatherings with Friends and Family: Not on file  . Attends Religious Services: Not on file  . Active Member of Clubs or Organizations: Not on file  . Attends Archivist Meetings: Not on file  . Marital Status: Not on file  Intimate Partner Violence:   . Fear of Current or Ex-Partner: Not on file  . Emotionally Abused: Not on file  . Physically Abused: Not on file  . Sexually Abused: Not on file    Family History  Problem Relation Age of Onset  . Kidney failure Father   . CAD Other        Several aunts had MIs  . Alzheimer's disease Other   . Diabetes Other     ROS- All systems are reviewed and negative except as per the HPI above  Physical Exam: There were no vitals filed for this visit. Wt Readings from Last 3 Encounters:  07/09/18 123.8 kg  04/25/18 118.3 kg  09/24/16 126.1 kg    Labs: Lab Results  Component Value Date   NA 140 07/16/2019   K 3.6 07/16/2019   CL 102 07/16/2019   CO2 25 07/16/2019   GLUCOSE 375 (H) 07/16/2019   BUN 18 07/16/2019    CREATININE 1.05 (H) 07/16/2019   CALCIUM 8.9 07/16/2019   MG 1.8 04/24/2018   Lab Results  Component Value Date   INR 1.16 02/08/2016   Lab Results  Component Value Date   CHOL 172 02/09/2016   HDL 72 02/09/2016   LDLCALC 81 02/09/2016   TRIG 94 02/09/2016     GEN- The patient is well appearing, alert and oriented x 3 today.   Head- normocephalic, atraumatic Eyes-  Sclera clear, conjunctiva pink Ears- hearing intact Oropharynx- clear Neck- supple, no JVP Lymph- no cervical lymphadenopathy Lungs- Clear to ausculation bilaterally, normal work of breathing Heart- Regular rate and rhythm, no murmurs, rubs or gallops, PMI not laterally displaced GI- soft, NT, ND, + BS Extremities- no clubbing, cyanosis, or edema MS- no significant deformity or atrophy Skin- no rash or lesion Psych- euthymic mood, full affect Neuro- strength and sensation are intact  EKG-NSR at 88 bpm, PR int 116 ms, qrs int 76 bpm, qtc 452 ms Epic records reviewed    Assessment and Plan: 1. Paroxysmal afib S/p successful cardioversion in ER  12/23 Trigger may have been viral illness( - ) covid test Maintaining SR since DCCV Encouraged her to use cpap Rx 30 mg cardizem to use as needed for breakthrough afib   2. HTN Encouraged to take BP meds on arrival home and try to be more compliant with their use  3.CHA2DS2VASc score of at least 4 Eliquis 5 mg bid Forgets to take at times Reminded  especially important not to forget for 4 weeks after a cardioversion   F/u in 3 months   Butch Penny C. Vale [place], [facility] [address] [place], [place] [address] [phone]

## 2019-07-30 ENCOUNTER — Other Ambulatory Visit (HOSPITAL_COMMUNITY): Payer: Self-pay | Admitting: Nurse Practitioner

## 2019-08-11 ENCOUNTER — Other Ambulatory Visit (HOSPITAL_COMMUNITY): Payer: Self-pay

## 2019-08-11 MED ORDER — DILTIAZEM HCL 30 MG PO TABS: ORAL_TABLET | ORAL | 3 refills | Status: DC

## 2019-10-20 ENCOUNTER — Encounter (HOSPITAL_COMMUNITY): Payer: Self-pay | Admitting: Nurse Practitioner

## 2019-10-20 ENCOUNTER — Ambulatory Visit (HOSPITAL_COMMUNITY)
Admission: RE | Admit: 2019-10-20 | Discharge: 2019-10-20 | Disposition: A | Discharge status: Home / Self Care | Payer: 59 | Source: Ambulatory Visit | Attending: Nurse Practitioner | Admitting: Nurse Practitioner

## 2019-10-20 ENCOUNTER — Other Ambulatory Visit: Payer: Self-pay

## 2019-10-20 VITALS — BP 152/90 | HR 75 | Ht 62.0 in | Wt 265.6 lb

## 2019-10-20 DIAGNOSIS — E785 Hyperlipidemia, unspecified: Secondary | ICD-10-CM | POA: Insufficient documentation

## 2019-10-20 DIAGNOSIS — Z841 Family history of disorders of kidney and ureter: Secondary | ICD-10-CM | POA: Diagnosis not present

## 2019-10-20 DIAGNOSIS — D6869 Other thrombophilia: Secondary | ICD-10-CM | POA: Diagnosis not present

## 2019-10-20 DIAGNOSIS — I480 Paroxysmal atrial fibrillation: Secondary | ICD-10-CM | POA: Diagnosis not present

## 2019-10-20 DIAGNOSIS — E669 Obesity, unspecified: Secondary | ICD-10-CM | POA: Insufficient documentation

## 2019-10-20 DIAGNOSIS — I4891 Unspecified atrial fibrillation: Secondary | ICD-10-CM | POA: Diagnosis present

## 2019-10-20 DIAGNOSIS — Z91018 Allergy to other foods: Secondary | ICD-10-CM | POA: Insufficient documentation

## 2019-10-20 DIAGNOSIS — Z79899 Other long term (current) drug therapy: Secondary | ICD-10-CM | POA: Insufficient documentation

## 2019-10-20 DIAGNOSIS — Z881 Allergy status to other antibiotic agents status: Secondary | ICD-10-CM | POA: Diagnosis not present

## 2019-10-20 DIAGNOSIS — Z888 Allergy status to other drugs, medicaments and biological substances status: Secondary | ICD-10-CM | POA: Insufficient documentation

## 2019-10-20 DIAGNOSIS — E119 Type 2 diabetes mellitus without complications: Secondary | ICD-10-CM | POA: Insufficient documentation

## 2019-10-20 DIAGNOSIS — K219 Gastro-esophageal reflux disease without esophagitis: Secondary | ICD-10-CM | POA: Insufficient documentation

## 2019-10-20 DIAGNOSIS — Z7901 Long term (current) use of anticoagulants: Secondary | ICD-10-CM | POA: Diagnosis not present

## 2019-10-20 DIAGNOSIS — Z7984 Long term (current) use of oral hypoglycemic drugs: Secondary | ICD-10-CM | POA: Diagnosis not present

## 2019-10-20 DIAGNOSIS — G4730 Sleep apnea, unspecified: Secondary | ICD-10-CM | POA: Diagnosis not present

## 2019-10-20 DIAGNOSIS — I10 Essential (primary) hypertension: Secondary | ICD-10-CM | POA: Diagnosis not present

## 2019-10-20 DIAGNOSIS — Z6841 Body Mass Index (BMI) 40.0 and over, adult: Secondary | ICD-10-CM | POA: Diagnosis not present

## 2019-10-20 NOTE — Progress Notes (Signed)
Primary Care Physician: Cari Caraway, MD Referring Physician: [facility] ER f/u   Angela [name] is a 59 y.o. female with a h/o afib first dx in 2017. She was on BB/xarelto and had debilitating legs pains. BB was stopped and she was switched to eliquis. Her afib had been very quiet but she was seen in the ER last Wednesday for a GI virus and afib with rvr at 158 bpm. She was successfully cardioverted. She is now over the GI bug. Her covid test was negative.   She remains in SR in the clinic. Her BP is elevated on presentation and on repeat check.  She states she forget her BP pills last night. She also forgot to take her eliquis pm and it was stressed not to miss any more doses, especially after a cardioversion.CHA2DS2VASc of at least 4. She denies using tobacco, very rare alcohol, does have sleep apnea and  does not use her cpap.   F/u in the afib clinic, 10/20/19. She is in SR. Pt denies any afib episodes. She  is pending a left vitrectomy, pan retinol photocoagulation, intravitreal lucentis with combined cataract surgery 10/30/19. Her ophthalmologist , Dr. Baird Cancer is requesting to stop eliquis 3 days before  scheduled surgery 4/8/2, which will be acceptable.   Today, she denies symptoms of palpitations, chest pain, shortness of breath, orthopnea, PND, lower extremity edema, dizziness, presyncope, syncope, or neurologic sequela. The patient is tolerating medications without difficulties and is otherwise without complaint today.   Past Medical History:  Diagnosis Date  . Atrial fibrillation with RVR ([place])   . Complication of anesthesia    had epidural for C-section and epidural did not take until after surgery  . Depression   . Diabetes mellitus without complication ([place])   . GERD (gastroesophageal reflux disease)   . Heart murmur   . Hyperlipidemia   . Hypertension   . Obesity   . Sleep apnea   . Temporal arteritis [facility])    Past Surgical History:  Procedure Laterality Date  . BREAST  SURGERY     bilateral breast reduction  . CARDIAC CATHETERIZATION N/A 02/09/2016   Procedure: Left Heart Cath and Coronary Angiography;  Surgeon: Sherren Mocha, MD;  Location: [facility];  Service: Cardiovascular;  Laterality: N/A;  . CESAREAN SECTION    . COLONOSCOPY    . TUBAL LIGATION      Current Outpatient Medications  Medication Sig Dispense Refill  . apixaban (ELIQUIS) 5 MG TABS tablet Take 1 tablet (5 mg total) by mouth 2 (two) times daily. 60 tablet 5  . chlorpheniramine-HYDROcodone (TUSSIONEX) 10-8 MG/5ML SUER Take 5 mLs by mouth 2 (two) times daily as needed for cough.    . diltiazem (CARDIZEM) 30 MG tablet TAKE 1 TABLET EVERY 4 HOURS IF HR IS >100 45 tablet 3  . dorzolamide-timolol (COSOPT) 22.3-6.8 MG/ML ophthalmic solution Place 1 drop into both eyes at bedtime.    Marland Kitchen esomeprazole (NEXIUM) 20 MG capsule Take 40 mg by mouth daily in the afternoon.     Marland Kitchen glimepiride (AMARYL) 4 MG tablet Take 1 tablet by mouth daily with breakfast.     . lisinopril (PRINIVIL,ZESTRIL) 40 MG tablet Take 40 mg by mouth daily.  3  . pravastatin (PRAVACHOL) 40 MG tablet Take 40 mg by mouth daily with supper.    . triamterene-hydrochlorothiazide (MAXZIDE-25) 37.5-25 MG tablet Take 1 tablet by mouth daily.     No current facility-administered medications for this encounter.    Allergies  Allergen Reactions  . E-Mycin [Erythromycin] Nausea And Vomiting  . Avocado Diarrhea and Nausea And Vomiting  . Banana Diarrhea and Nausea And Vomiting  . Glucophage [Metformin Hcl] Diarrhea and Nausea And Vomiting  . Kiwi Extract Diarrhea and Nausea And Vomiting  . Mango Flavor Diarrhea  . Metoprolol     Unclear, patient was also started on Xarelto, developed severe itching and LE weakness, stopped both and symptoms resolved  . Xarelto [Rivaroxaban]     Unclear, patient was also started on Metoprolol, developed severe itching/LE weakness, stopped both and her and symptoms resolved  . Levemir [Insulin  Detemir] Rash  . Lexapro [Escitalopram Oxalate] Itching and Other (See Comments)    Numb lips    Social History   Socioeconomic History  . Marital status: Married    Spouse name: Not on file  . Number of children: Not on file  . Years of education: Not on file  . Highest education level: Not on file  Occupational History  . Not on file  Tobacco Use  . Smoking status: Never Smoker  . Smokeless tobacco: Never Used  Substance and Sexual Activity  . Alcohol use: Yes    Alcohol/week: 1.0 standard drinks    Types: 1 Cans of beer per week    Comment: rare  . Drug use: No  . Sexual activity: Not on file  Other Topics Concern  . Not on file  Social History Narrative  . Not on file   Social Determinants of Health   Financial Resource Strain:   . Difficulty of Paying Living Expenses:   Food Insecurity:   . Worried About Charity fundraiser in the Last Year:   . Arboriculturist in the Last Year:   Transportation Needs:   . Film/video editor (Medical):   Marland Kitchen Lack of Transportation (Non-Medical):   Physical Activity:   . Days of Exercise per Week:   . Minutes of Exercise per Session:   Stress:   . Feeling of Stress :   Social Connections:   . Frequency of Communication with Friends and Family:   . Frequency of Social Gatherings with Friends and Family:   . Attends Religious Services:   . Active Member of Clubs or Organizations:   . Attends Archivist Meetings:   Marland Kitchen Marital Status:   Intimate Partner Violence:   . Fear of Current or Ex-Partner:   . Emotionally Abused:   Marland Kitchen Physically Abused:   . Sexually Abused:     Family History  Problem Relation Age of Onset  . Kidney failure Father   . CAD Other        Several aunts had MIs  . Alzheimer's disease Other   . Diabetes Other     ROS- All systems are reviewed and negative except as per the HPI above  Physical Exam: There were no vitals filed for this visit. Wt Readings from Last 3 Encounters:    07/21/19 264 lb 6.4 oz (119.9 kg)  07/09/18 273 lb (123.8 kg)  04/25/18 260 lb 12.9 oz (118.3 kg)    Labs: Lab Results  Component Value Date   NA 140 07/16/2019   K 3.6 07/16/2019   CL 102 07/16/2019   CO2 25 07/16/2019   GLUCOSE 375 (H) 07/16/2019   BUN 18 07/16/2019   CREATININE 1.05 (H) 07/16/2019   CALCIUM 8.9 07/16/2019   MG 1.8 04/24/2018   Lab Results  Component Value Date   INR 1.16 02/08/2016  Lab Results  Component Value Date   CHOL 172 02/09/2016   HDL 72 02/09/2016   LDLCALC 81 02/09/2016   TRIG 94 02/09/2016     GEN- The patient is well appearing, alert and oriented x 3 today.   Head- normocephalic, atraumatic Eyes-  Sclera clear, conjunctiva pink Ears- hearing intact Oropharynx- clear Neck- supple, no JVP Lymph- no cervical lymphadenopathy Lungs- Clear to ausculation bilaterally, normal work of breathing Heart- Regular rate and rhythm, no murmurs, rubs or gallops, PMI not laterally displaced GI- soft, NT, ND, + BS Extremities- no clubbing, cyanosis, or edema MS- no significant deformity or atrophy Skin- no rash or lesion Psych- euthymic mood, full affect Neuro- strength and sensation are intact  EKG-NSR at 88 bpm, PR int 116 ms, qrs int 76 bpm, qtc 452 ms Epic records reviewed    Assessment and Plan: 1. Paroxysmal afib S/p successful cardioversion in ER 07/16/19 Trigger may have been viral illness( - ) covid test Maintaining SR since DCCV Encouraged her to use cpap Rx 30 mg cardizem to use as needed for breakthrough afib   2. HTN Elevated today Does not check at home as she does not have a BP cuff PCP follows BP  Will give her a BP cuff that has been supplied to [place] by the [facility] and have taught her how to use If remains elevated over the next few days, f/u with PCP   3.CHA2DS2VASc score of at least 4 Eliquis 5 mg bid She can stop anticoagulation  for 3 days prior to eye surgery 4/8, resume as  soon as possible after surgery   F/u here in 6 months   Butch Penny C. Evyn Putzier, [facility] [address] [place], [place] [address] [phone]

## 2019-11-06 ENCOUNTER — Ambulatory Visit: Payer: 59 | Attending: Internal Medicine

## 2019-11-06 DIAGNOSIS — Z23 Encounter for immunization: Secondary | ICD-10-CM

## 2019-11-06 NOTE — Progress Notes (Signed)
   Covid-19 Vaccination Clinic  Name:  Angela [name]    MRN: [id number] DOB: [birth year]  11/06/2019  Angela [name] was observed post Covid-19 immunization for 30 minutes based on pre-vaccination screening without incident. She was provided with Vaccine Information Sheet and instruction to access the V-Safe system.   Angela [name] was instructed to call 911 with any severe reactions post vaccine: Marland Kitchen Difficulty breathing  . Swelling of face and throat  . A fast heartbeat  . A bad rash all over body  . Dizziness and weakness   Immunizations Administered    Name Date Dose VIS Date Route   Pfizer COVID-19 Vaccine 11/06/2019 11:05 AM 0.3 mL 07/04/2019 Intramuscular   Manufacturer: [place]   Lot: [id number]   [place]: [id number]

## 2019-11-06 NOTE — Progress Notes (Signed)
   Covid-19 Vaccination Clinic  Name:  [name]    MRN: [id number] DOB: 1961/03/28  11/06/2019  [name] was observed post Covid-19 immunization for 15 minutes without incident. She was provided with Vaccine Information Sheet and instruction to access the V-Safe system.   [name] was instructed to call 911 with any severe reactions post vaccine: Marland Kitchen Difficulty breathing  . Swelling of face and throat  . A fast heartbeat  . A bad rash all over body  . Dizziness and weakness   Immunizations Administered    Name Date Dose VIS Date Route   Pfizer COVID-19 Vaccine 11/06/2019 11:05 AM 0.3 mL 07/04/2019 Intramuscular   Manufacturer: [place]   Lot: [id number]   [place]: [address]-3

## 2019-12-01 ENCOUNTER — Ambulatory Visit: Payer: 59 | Attending: Internal Medicine

## 2019-12-01 DIAGNOSIS — Z23 Encounter for immunization: Secondary | ICD-10-CM

## 2019-12-01 NOTE — Progress Notes (Signed)
   Covid-19 Vaccination Clinic  Name:  Angela [name]    MRN: [id number] DOB: 1961-07-16  12/01/2019  Ms. Game was observed post Covid-19 immunization for 15 minutes without incident. She was provided with Vaccine Information Sheet and instruction to access the V-Safe system.   [name] was instructed to call 911 with any severe reactions post vaccine: Marland Kitchen Difficulty breathing  . Swelling of face and throat  . A fast heartbeat  . A bad rash all over body  . Dizziness and weakness   Immunizations Administered    Name Date Dose VIS Date Route   Pfizer COVID-19 Vaccine 12/01/2019 10:18 AM 0.3 mL 09/17/2018 Intramuscular   Manufacturer: [place]   Lot: [id number]   [place]: [id number]

## 2021-02-07 NOTE — Progress Notes (Deleted)
Cardiology Office Note:    Date:  02/07/2021   ID:  Angela [name], DOB [name], MRN [id number]  PCP:  Cari Caraway, MD   [facility] Providers Cardiologist:  None { Click to update primary MD,subspecialty MD or APP then REFRESH:1}    Referring MD: Cari Caraway, MD   No chief complaint on file. ***  History of Present Illness:    Angela [name] is a 60 y.o. female with a hx of ***  Past Medical History:  Diagnosis Date   Atrial fibrillation with RVR ([place])    Complication of anesthesia    had epidural for C-section and epidural did not take until after surgery   Depression    Diabetes mellitus without complication ([facility])    GERD (gastroesophageal reflux disease)    Heart murmur    Hyperlipidemia    Hypertension    Obesity    Sleep apnea    Temporal arteritis ([place])     Past Surgical History:  Procedure Laterality Date   BREAST SURGERY     bilateral breast reduction   CARDIAC CATHETERIZATION Angela/A 02/09/2016   Procedure: Left Heart Cath and Coronary Angiography;  Surgeon: Sherren Mocha, MD;  Location: [facility];  Service: Cardiovascular;  Laterality: Angela/A;   CESAREAN SECTION     COLONOSCOPY     TUBAL LIGATION      Current Medications: No outpatient medications have been marked as taking for the 02/09/21 encounter (Appointment) with Freada Bergeron, MD.     Allergies:   E-mycin [erythromycin], Avocado, Banana, Glucophage [metformin hcl], Kiwi extract, Mango flavor, Metoprolol, Xarelto [rivaroxaban], Levemir [insulin detemir], and Lexapro [escitalopram oxalate]   Social History   Socioeconomic History   Marital status: Married    Spouse name: Not on file   Number of children: Not on file   Years of education: Not on file   Highest education level: Not on file  Occupational History   Not on file  Tobacco Use   Smoking status: Never   Smokeless tobacco: Never  Vaping Use   Vaping Use: Never used  Substance and Sexual Activity    Alcohol use: Yes    Alcohol/week: 2.0 standard drinks    Types: 2 Cans of beer per week    Comment: rare   Drug use: No   Sexual activity: Not on file  Other Topics Concern   Not on file  Social History Narrative   Not on file   Social Determinants of Health   Financial Resource Strain: Not on file  Food Insecurity: Not on file  Transportation Needs: Not on file  Physical Activity: Not on file  Stress: Not on file  Social Connections: Not on file     Family History: The patient's ***family history includes Alzheimer's disease in an other family member; CAD in an other family member; Diabetes in an other family member; Kidney failure in her father.  ROS:   Please see the history of present illness.    *** All other systems reviewed and are negative.  EKGs/Labs/Other Studies Reviewed:    The following studies were reviewed today: ***  EKG:  EKG is *** ordered today.  The ekg ordered today demonstrates ***  Recent Labs: No results found for requested labs within last 8760 hours.  Recent Lipid Panel    Component Value Date/Time   CHOL 172 02/09/2016 0734   TRIG 94 02/09/2016 0734   HDL 72 02/09/2016 0734   CHOLHDL 2.4 02/09/2016 0734  VLDL 19 02/09/2016 0734   LDLCALC 81 02/09/2016 0734     Risk Assessment/Calculations:   {Does this patient have ATRIAL FIBRILLATION?:[phone]}       Physical Exam:    VS:  There were no vitals taken for this visit.    Wt Readings from Last 3 Encounters:  10/20/19 265 lb 9.6 oz (120.5 kg)  07/21/19 264 lb 6.4 oz (119.9 kg)  07/09/18 273 lb (123.8 kg)     GEN: *** Well nourished, well developed in no acute distress HEENT: Normal NECK: No JVD; No carotid bruits LYMPHATICS: No lymphadenopathy CARDIAC: ***RRR, no murmurs, rubs, gallops RESPIRATORY:  Clear to auscultation without rales, wheezing or rhonchi  ABDOMEN: Soft, non-tender, non-distended MUSCULOSKELETAL:  No edema; No deformity  SKIN: Warm and dry NEUROLOGIC:   Alert and oriented x 3 PSYCHIATRIC:  Normal affect   ASSESSMENT:    No diagnosis found. PLAN:    In order of problems listed above:  ***   {Are you ordering a CV Procedure (e.g. stress test, cath, DCCV, TEE, etc)?   Press F2        :[id number]    Medication Adjustments/Labs and Tests Ordered: Current medicines are reviewed at length with the patient today.  Concerns regarding medicines are outlined above.  No orders of the defined types were placed in this encounter.  No orders of the defined types were placed in this encounter.   There are no Patient Instructions on file for this visit.   Signed, Freada Bergeron, MD  02/07/2021 8:27 PM    [place]

## 2021-02-09 ENCOUNTER — Ambulatory Visit: Payer: 59 | Admitting: Cardiology

## 2021-03-07 ENCOUNTER — Ambulatory Visit: Payer: 59 | Admitting: Cardiology

## 2021-04-15 ENCOUNTER — Ambulatory Visit (INDEPENDENT_AMBULATORY_CARE_PROVIDER_SITE_OTHER): Payer: 59 | Admitting: Cardiovascular Disease

## 2021-04-15 ENCOUNTER — Encounter: Payer: Self-pay | Admitting: Cardiovascular Disease

## 2021-04-15 ENCOUNTER — Other Ambulatory Visit: Payer: Self-pay

## 2021-04-15 VITALS — BP 139/72 | HR 75 | Ht 62.0 in | Wt 291.0 lb

## 2021-04-15 DIAGNOSIS — I50812 Chronic right heart failure: Secondary | ICD-10-CM

## 2021-04-15 DIAGNOSIS — I5032 Chronic diastolic (congestive) heart failure: Secondary | ICD-10-CM | POA: Diagnosis not present

## 2021-04-15 DIAGNOSIS — I10 Essential (primary) hypertension: Secondary | ICD-10-CM

## 2021-04-15 DIAGNOSIS — G4733 Obstructive sleep apnea (adult) (pediatric): Secondary | ICD-10-CM

## 2021-04-15 DIAGNOSIS — E7800 Pure hypercholesterolemia, unspecified: Secondary | ICD-10-CM

## 2021-04-15 DIAGNOSIS — E1165 Type 2 diabetes mellitus with hyperglycemia: Secondary | ICD-10-CM

## 2021-04-15 DIAGNOSIS — I480 Paroxysmal atrial fibrillation: Secondary | ICD-10-CM | POA: Diagnosis not present

## 2021-04-15 DIAGNOSIS — N1831 Chronic kidney disease, stage 3a: Secondary | ICD-10-CM

## 2021-04-15 NOTE — Progress Notes (Signed)
Cardiology Office Note:    Date:  04/16/2021   ID:  Angela [name], DOB 07-26-1960, MRN [id number]  PCP:  Cari Caraway, MD   [facility] Providers Cardiologist:  [name], MD     Referring MD: Cari Caraway, MD   No chief complaint on file. Angela [name] is a 60 y.o. female who is being seen today for the evaluation of atrial fibrillation at the request of Cari Caraway, MD.   History of Present Illness:    Angela [name] is a 60 y.o. 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 fib clinic (not seen since March 2021).  She bears a diagnosis of chronic diastolic heart failure.  She has a history of major depression.  She had COVID-19 infection around Easter and was very sick.  She had a sore throat and pain in her right ear and persistent chest pain for about a month.  She has had increased dyspnea ever since and has had increased frequency of atrial fibrillation.  She is currently in sinus rhythm.  She does not have palpitations.  She denies dizziness or syncope.  She has chronic severe brawny edema of the lower extremities but has never had venous stasis ulcers.  She denies of orthopnea or PND but has been sleeping upright for years due to GERD.  She does not have chest pain currently either at rest or with activity, but becomes easily short of breath with light activity.  She reports an adverse reaction when she was prescribed both Xarelto and metoprolol when she developed severe itching and leg weakness, resolved after she stopped both medications.  The exact culprit is unclear.  She is doing well on Eliquis and carvedilol.  Glycemic control is poor with a recent hemoglobin A1c of 9.9%.  She has excellent lipid parameters on rosuvastatin, with a recent LDL of 50 and HDL of 63.  She has abnormal renal  function with a recent creatinine of 1.76.  She becomes very teary-eyed when asked about nephrologist evaluation and tells me that she does not want to go on dialysis.  She has a history of obstructive sleep apnea and underwent a sleep study and BiPAP titration in 2018, but is not using CPAP at this time.  She does have the equipment.  She had a cardiac catheterization in 2017 when she presented with atrial fibrillation with RVR and abnormal cardiac enzymes, had normal coronary arteries angiographically and evidence of elevated left ventricular end-diastolic pressure.  Her echo in 2017 suggests the presence of LVH, mild left atrial dilation and diastolic dysfunction.  The echo in 2017 as well as a follow-up study in 2019 shows normal left and right ventricular systolic function.  Past Medical History:  Diagnosis Date   Atrial fibrillation with RVR ([place])    Complication of anesthesia    had epidural for C-section and epidural did not take until after surgery   Depression    Diabetes mellitus without complication (HCC)    GERD (gastroesophageal reflux disease)    Heart murmur    Hyperlipidemia    Hypertension    Obesity    Sleep apnea    Temporal arteritis ([place])     Past Surgical History:  Procedure Laterality Date   BREAST SURGERY     bilateral breast reduction   CARDIAC CATHETERIZATION N/A 02/09/2016  Procedure: Left Heart Cath and Coronary Angiography;  Surgeon: Sherren Mocha, MD;  Location: [facility];  Service: Cardiovascular;  Laterality: N/A;   CESAREAN SECTION     COLONOSCOPY     TUBAL LIGATION      Current Medications: Current Meds  Medication Sig   apixaban (ELIQUIS) 5 MG TABS tablet Take 1 tablet (5 mg total) by mouth 2 (two) times daily.   carvedilol (COREG) 3.125 MG tablet Take 3.125 mg by mouth 2 (two) times daily with a meal.   diltiazem (CARDIZEM) 30 MG tablet TAKE 1 TABLET EVERY 4 HOURS IF HR IS >100   dorzolamide-timolol (COSOPT) 22.3-6.8 MG/ML ophthalmic  solution Place 1 drop into both eyes at bedtime.   esomeprazole (NEXIUM) 20 MG capsule Take 40 mg by mouth daily in the afternoon.    glimepiride (AMARYL) 4 MG tablet Take 1 tablet by mouth daily with breakfast.    Insulin Glargine (BASAGLAR KWIKPEN) 100 UNIT/ML Inject 50 Units into the skin 2 (two) times daily.   lisinopril (PRINIVIL,ZESTRIL) 40 MG tablet Take 40 mg by mouth daily.   metFORMIN (GLUCOPHAGE) 500 MG tablet Take 500 mg by mouth 2 (two) times daily with a meal.   rosuvastatin (CRESTOR) 20 MG tablet Take 20 mg by mouth daily.   triamterene-hydrochlorothiazide (DYAZIDE) 37.5-25 MG capsule Take 1 capsule by mouth daily.     Allergies:   E-mycin [erythromycin], Avocado, Banana, Glucophage [metformin hcl], Kiwi extract, Mango flavor, Metoprolol, Xarelto [rivaroxaban], Levemir [insulin detemir], and Lexapro [escitalopram oxalate]   Social History   Socioeconomic History   Marital status: Married    Spouse name: Not on file   Number of children: Not on file   Years of education: Not on file   Highest education level: Not on file  Occupational History   Not on file  Tobacco Use   Smoking status: Never   Smokeless tobacco: Never  Vaping Use   Vaping Use: Never used  Substance and Sexual Activity   Alcohol use: Yes    Alcohol/week: 2.0 standard drinks    Types: 2 Cans of beer per week    Comment: rare   Drug use: No   Sexual activity: Not on file  Other Topics Concern   Not on file  Social History Narrative   Not on file   Social Determinants of Health   Financial Resource Strain: Not on file  Food Insecurity: Not on file  Transportation Needs: Not on file  Physical Activity: Not on file  Stress: Not on file  Social Connections: Not on file     Family History: The patient's family history includes Alzheimer's disease in an other family member; CAD in an other family member; Diabetes in an other family member; Kidney failure in her father.  ROS:   Please see the  history of present illness.     All other systems reviewed and are negative.  EKGs/Labs/Other Studies Reviewed:    The following studies were reviewed today: Cardiac catheterization 02/09/2016 1. Angiographically normal coronary arteries 2. Elevated LVEDP  Echocardiogram 02/09/2016  - Left ventricle: The cavity size was normal. Wall thickness was    increased in a pattern of moderate LVH. Systolic function was    normal. The estimated ejection fraction was in the range of 60%    to 65%. Wall motion was normal; there were no regional wall    motion abnormalities. Doppler parameters are consistent with    abnormal left ventricular relaxation (grade 1 diastolic  dysfunction). The E/e&' ratio is >15, suggesting elevated LV    filling pressure.  - Aortic valve: Trileaflet. Sclerosis without stenosis. There was    no regurgitation.  - Mitral valve: Calcified annulus. Mildly thickened leaflets .    There was trivial regurgitation.  - Left atrium: The atrium was mildly dilated.  - Tricuspid valve: There was trivial regurgitation.  - Pulmonary arteries: PA peak pressure: 23 mm Hg (S).  - Inferior vena cava: The vessel was normal in size. The    respirophasic diameter changes were in the normal range (>= 50%),    consistent with normal central venous pressure.   Echocardiogram 04/25/2018 - Left ventricle: The cavity size was normal. Wall thickness was    increased in a pattern of mild LVH. Systolic function was    vigorous. The estimated ejection fraction was in the range of 65%    to 70%.  - Mitral valve: There was mild regurgitation.     EKG:  EKG is ordered today.  The ekg ordered today demonstrates normal sinus rhythm, nonspecific T wave changes, QTC 406 ms  Recent Labs: No results found for requested labs within last 8760 hours.  09/12//2022 Hemoglobin A1c 9.9% Creatinine 1.76, potassium 4.0  Recent Lipid Panel    Component Value Date/Time   CHOL 172 02/09/2016 0734    TRIG 94 02/09/2016 0734   HDL 72 02/09/2016 0734   CHOLHDL 2.4 02/09/2016 0734   VLDL 19 02/09/2016 0734   LDLCALC 81 02/09/2016 0734  08/23/2020 Cholesterol 128, HDL 63, LDL 50, triglycerides 73   Risk Assessment/Calculations:    CHA2DS2-VASc Score = 4   This indicates a 4.8% annual risk of stroke. The patient's score is based upon: CHF History: 1 HTN History: 1 Diabetes History: 1 Stroke History: 0 Vascular Disease History: 0 Age Score: 0 Gender Score: 1         Physical Exam:    VS:  BP 139/72   Pulse 75   Ht '5\' 2"'$  (1.575 m)   Wt 132 kg   SpO2 98%   BMI 53.22 kg/m     Wt Readings from Last 3 Encounters:  04/15/21 132 kg  10/20/19 120.5 kg  07/21/19 119.9 kg     GEN: Super morbidly obese well nourished, well developed in no acute distress HEENT: Normal NECK: Unable to evaluate the JVD; No carotid bruits LYMPHATICS: No lymphadenopathy CARDIAC: RRR, early peaking 1-2/6 aortic ejection murmur, no diastolic murmurs, rubs, gallops RESPIRATORY:  Clear to auscultation without rales, wheezing or rhonchi  ABDOMEN: Soft, non-tender, non-distended MUSCULOSKELETAL: Chronic brawny 3+ edema with tough scaly skin and dependent cyanosis; no deformity  SKIN: Warm and dry NEUROLOGIC:  Alert and oriented x 3 PSYCHIATRIC:  Normal affect   ASSESSMENT:    1. Paroxysmal atrial fibrillation (HCC)   2. Chronic diastolic heart failure ([place])   3. Chronic right heart failure ([place])   4. OSA (obstructive sleep apnea)   5. Morbid obesity ([place])   6. Essential hypertension   7. Uncontrolled type 2 diabetes mellitus with hyperglycemia ([place])   8. Stage 3a chronic kidney disease ([place])   9. Hypercholesterolemia    PLAN:    In order of problems listed above:  AFib: She has had relatively infrequent symptomatic events in the 5 years since this was diagnosed and is currently in normal rhythm.  Exacerbations tend to occur with intercurrent illnesses such as viral infections, which was  the case earlier this year.  She is on appropriate anticoagulation.  I am skeptical that the very low-dose of carvedilol provide good rate control, we will have to evaluate this at her next exacerbation.  Antiarrhythmics do not appear to be indicated. CHF: While there was objective evidence of diastolic left heart failure when she had atrial fibrillation in the past, I believe her history and clinical exam are mostly compatible with severe right heart failure, probably due to untreated OSA.  She is receiving triamterene hydrochlorothiazide but is not on loop diuretics.  She does not have any acute skin complications, but does appear to have very severe chronic edema related changes in her legs.  Aggressive diuresis may worsen her kidney function.  At this point I think the focus should be on trying to treat the root cause of her problem, which is the sleep apnea. OSA: She had a sleep study and BiPAP titration study several years ago with Dr. Radford Pax.  We will try to get her plugged back into the system.  She does have the equipment, but has not been using it.  I wonder if we can just arrange a BiPAP titration trial. Super-obesity: Makes her exam very challenging and it is hard to assess her "dry weight".  Underpins many of her health problems including OSA, DM, HTN. HTN: Fair control.  Target less than 130/80.  She is on a complex medical regimen that includes maximum dose lisinopril, triamterene-hydrochlorothiazide and carvedilol.  There is room to increase the carvedilol if necessary. DM: Poorly compensated complicated by retinopathy and nephropathy.  High risk for progression of her renal disease.  Recommend nephrology referral. [id number]: With proteinuria.  Due to diabetic nephropathy.  Back in 2020 her creatinine was 1.0.  In January 2022 creatinine was 1.33.  Since her COVID-19 infection in the spring her creatinine has been 1.61-1.81.  She became very teary-eyed the need for close monitoring of her kidney  function and follow-up with a nephrologist.  She told me "I do not want to go on dialysis".  At this point I do not see a reason to aggressively treat with diuretics.  As most patients with right heart failure, she can tolerate significant volume overload without dyspnea. HLP: Currently good lipid parameters despite the poorly controlled glucose.  Continue the current treatment with rosuvastatin        Medication Adjustments/Labs and Tests Ordered: Current medicines are reviewed at length with the patient today.  Concerns regarding medicines are outlined above.  Orders Placed This Encounter  Procedures   EKG 12-Lead   No orders of the defined types were placed in this encounter.   Patient Instructions  Medication Instructions:  No changes *If you need a refill on your cardiac medications before your next appointment, please call your pharmacy*   Lab Work: None ordered If you have labs (blood work) drawn today and your tests are completely normal, you will receive your results only by: [place] (if you have MyChart) OR A paper copy in the mail If you have any lab test that is abnormal or we need to change your treatment, we will call you to review the results.   Testing/Procedures: None ordered   Follow-Up: At [facility], you and your health needs are our priority.  As part of our continuing mission to provide you with exceptional heart care, we have created designated Provider Care Teams.  These Care Teams include your primary Cardiologist (physician) and Advanced Practice Providers (APPs -  Physician Assistants and Nurse Practitioners) who all work together to provide you with  the care you need, when you need it.  We recommend signing up for the patient portal called "MyChart".  Sign up information is provided on this After Visit Summary.  MyChart is used to connect with patients for Virtual Visits (Telemedicine).  Patients are able to view lab/test results, encounter  notes, upcoming appointments, etc.  Non-urgent messages can be sent to your provider as well.   To learn more about what you can do with MyChart, go to [email or web address].    Your next appointment:   3 month(s)  The format for your next appointment:   In Person  Provider:   You may see Dr. Sallyanne Kuster or one of the following Advanced Practice Providers on your designated Care Team:   Almyra Deforest, PA-C Fabian Sharp, [place] or  Roby Lofts, PA-C    Signed, [name], MD  04/16/2021 5:41 AM    [place]

## 2021-04-15 NOTE — Patient Instructions (Signed)
Medication Instructions:  No changes *If you need a refill on your cardiac medications before your next appointment, please call your pharmacy*   Lab Work: None ordered If you have labs (blood work) drawn today and your tests are completely normal, you will receive your results only by: [place] (if you have MyChart) OR A paper copy in the mail If you have any lab test that is abnormal or we need to change your treatment, we will call you to review the results.   Testing/Procedures: None ordered   Follow-Up: At [facility], you and your health needs are our priority.  As part of our continuing mission to provide you with exceptional heart care, we have created designated Provider Care Teams.  These Care Teams include your primary Cardiologist (physician) and Advanced Practice Providers (APPs -  Physician Assistants and Nurse Practitioners) who all work together to provide you with the care you need, when you need it.  We recommend signing up for the patient portal called "MyChart".  Sign up information is provided on this After Visit Summary.  MyChart is used to connect with patients for Virtual Visits (Telemedicine).  Patients are able to view lab/test results, encounter notes, upcoming appointments, etc.  Non-urgent messages can be sent to your provider as well.   To learn more about what you can do with MyChart, go to [email or web address].    Your next appointment:   3 month(s)  The format for your next appointment:   In Person  Provider:   You may see Dr. Sallyanne Kuster or one of the following Advanced Practice Providers on your designated Care Team:   Almyra Deforest, PA-C Fabian Sharp, PA-C or  Roby Lofts, [place]

## 2021-04-16 ENCOUNTER — Encounter: Payer: Self-pay | Admitting: Cardiovascular Disease

## 2021-04-16 DIAGNOSIS — I5032 Chronic diastolic (congestive) heart failure: Secondary | ICD-10-CM | POA: Insufficient documentation

## 2021-04-16 DIAGNOSIS — I50812 Chronic right heart failure: Secondary | ICD-10-CM | POA: Insufficient documentation

## 2021-04-16 DIAGNOSIS — N1831 Chronic kidney disease, stage 3a: Secondary | ICD-10-CM | POA: Insufficient documentation

## 2021-04-28 ENCOUNTER — Telehealth: Payer: Self-pay | Admitting: *Deleted

## 2021-04-28 DIAGNOSIS — G4733 Obstructive sleep apnea (adult) (pediatric): Secondary | ICD-10-CM

## 2021-04-28 NOTE — Telephone Encounter (Signed)
DENIED BIPAP TIT. [id number]: This is confirmation that you have requested to cancel your request for an attended sleep study ([id number] or [phone]) with the intention to move forward with a home sleep study.   Home sleep studies ([id number], [place], [place], [address], [id number]) do not require prior authorization

## 2021-04-28 NOTE — Telephone Encounter (Signed)
-----   Message from Sueanne Margarita, MD sent at 04/18/2021 11:02 AM EDT ----- Angela [name],  Please set her up for in lab BiPAP titration so we can get her on appropriate settings and then see me back in office after study  Traci ----- Message ----- From: [name], MD Sent: 04/16/2021   5:43 AM EDT To: Sueanne Margarita, MD, Lauralee Evener, CMA  This patient has right heart failure due to OSA. She had BiPAP titration 2018 and has the CPAP equipment, but has not used it in years (she owns it). How can we get her started back up, please?

## 2021-05-04 NOTE — Telephone Encounter (Signed)
Informed patient of upcoming home sleep study and patient understanding was verbalized.  Patient understands her/his HST is scheduled for 06/01/21 at 1pm Pt is aware of testing date.

## 2021-06-01 ENCOUNTER — Ambulatory Visit (HOSPITAL_BASED_OUTPATIENT_CLINIC_OR_DEPARTMENT_OTHER): Payer: 59 | Attending: Cardiology | Admitting: Cardiology

## 2021-06-01 ENCOUNTER — Other Ambulatory Visit: Payer: Self-pay

## 2021-06-20 NOTE — Progress Notes (Signed)
This encounter was created in error - please disregard.

## 2021-06-20 NOTE — Procedures (Signed)
Erroneous encounter

## 2021-06-23 ENCOUNTER — Encounter: Payer: Self-pay | Admitting: Cardiovascular Disease

## 2021-06-23 ENCOUNTER — Other Ambulatory Visit: Payer: Self-pay

## 2021-06-23 ENCOUNTER — Ambulatory Visit (INDEPENDENT_AMBULATORY_CARE_PROVIDER_SITE_OTHER): Payer: 59 | Admitting: Cardiovascular Disease

## 2021-06-23 VITALS — BP 132/64 | HR 78 | Ht 62.0 in | Wt 289.0 lb

## 2021-06-23 DIAGNOSIS — I50812 Chronic right heart failure: Secondary | ICD-10-CM | POA: Diagnosis not present

## 2021-06-23 DIAGNOSIS — E1165 Type 2 diabetes mellitus with hyperglycemia: Secondary | ICD-10-CM

## 2021-06-23 DIAGNOSIS — I10 Essential (primary) hypertension: Secondary | ICD-10-CM

## 2021-06-23 DIAGNOSIS — E7800 Pure hypercholesterolemia, unspecified: Secondary | ICD-10-CM

## 2021-06-23 DIAGNOSIS — I480 Paroxysmal atrial fibrillation: Secondary | ICD-10-CM | POA: Diagnosis not present

## 2021-06-23 DIAGNOSIS — G4733 Obstructive sleep apnea (adult) (pediatric): Secondary | ICD-10-CM

## 2021-06-23 DIAGNOSIS — Z7901 Long term (current) use of anticoagulants: Secondary | ICD-10-CM

## 2021-06-23 DIAGNOSIS — N1831 Chronic kidney disease, stage 3a: Secondary | ICD-10-CM

## 2021-06-23 NOTE — Progress Notes (Signed)
Cardiology Office Note:    Date:  06/25/2021   ID:  Angela [name], DOB 1961/01/27, MRN [id number]  PCP:  Cari Caraway, MD   [facility] Providers Cardiologist:  [name], MD     Referring MD: Cari Caraway, MD   Chief Complaint  Patient presents with   Atrial Fibrillation      History of Present Illness:    Angela [name] is a 60 y.o. 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 fib clinic (not seen chronic diastolic heart failure.  She has a history of major depression.  After having markedly increased frequency of atrial fibrillation as well as worsening dyspnea during COVID-19, she is returned to baseline.  Problems with palpitations are less frequent.  She continues to have NYHA functional class III dyspnea (sometimes she gets short of breath and getting dressed, definitely gets short of breath when walking in a store).  When she has atrial fibrillation she has palpitations and nausea, but "it has been a while since she had one".  When the episodes of atrial fibrillation occur they last for approximately a day.  She has a prescription for short acting diltiazem to take during atrial fibrillation, but has not needed it recently.  Schedule for home sleep study/CPAP titration on 06/01/2021.  I do not see the results of that.  In sinus rhythm today.  Denies dizziness or syncope.  Continues to have severe edema of both lower extremities but does not have any venous stasis ulcers.  She does not have orthopnea, PND, but customarily sleeps in a recliner (for GERD)..  She reports an adverse reaction when she was prescribed both Xarelto and metoprolol when she developed severe itching and leg weakness, resolved after she stopped both medications.  The exact culprit is unclear.  She is doing well on Eliquis and  carvedilol.  Glycemic control is poor with a recent hemoglobin A1c of 9.9%.  She has excellent lipid parameters on rosuvastatin, with a recent LDL of 50 and HDL of 63.  She has abnormal renal function with a recent creatinine of 1.76.    She has a history of obstructive sleep apnea and underwent a sleep study and BiPAP titration in 2018, but is not using CPAP at this time.  She does have the equipment.  She had a cardiac catheterization in 2017 when she presented with atrial fibrillation with RVR and abnormal cardiac enzymes, had normal coronary arteries angiographically and evidence of elevated left ventricular end-diastolic pressure.  Her echo in 2017 suggests the presence of LVH, mild left atrial dilation and diastolic dysfunction.  The echo in 2017 as well as a follow-up study in 2019 shows normal left and right ventricular systolic function.  Past Medical History:  Diagnosis Date   Atrial fibrillation with RVR ([place])    Complication of anesthesia    had epidural for C-section and epidural did not take until after surgery   Depression    Diabetes mellitus without complication (HCC)    GERD (gastroesophageal reflux disease)    Heart murmur    Hyperlipidemia    Hypertension    Obesity    Sleep apnea    Temporal arteritis ([place])     Past Surgical History:  Procedure Laterality Date   BREAST SURGERY     bilateral breast reduction   CARDIAC CATHETERIZATION N/A 02/09/2016  Procedure: Left Heart Cath and Coronary Angiography;  Surgeon: Sherren Mocha, MD;  Location: [facility];  Service: Cardiovascular;  Laterality: N/A;   CESAREAN SECTION     COLONOSCOPY     TUBAL LIGATION      Current Medications: Current Meds  Medication Sig   amLODipine (NORVASC) 5 MG tablet Take 5 mg by mouth daily.   apixaban (ELIQUIS) 5 MG TABS tablet Take 1 tablet (5 mg total) by mouth 2 (two) times daily.   carvedilol (COREG) 3.125 MG tablet Take 3.125 mg by mouth 2 (two) times daily with a meal.    dorzolamide-timolol (COSOPT) 22.3-6.8 MG/ML ophthalmic solution Place 1 drop into both eyes at bedtime.   glimepiride (AMARYL) 4 MG tablet Take 1 tablet by mouth daily with breakfast.    Insulin Glargine (BASAGLAR KWIKPEN) 100 UNIT/ML Inject 50 Units into the skin 2 (two) times daily.   lisinopril (PRINIVIL,ZESTRIL) 40 MG tablet Take 40 mg by mouth daily.   rosuvastatin (CRESTOR) 20 MG tablet Take 20 mg by mouth daily.   triamterene-hydrochlorothiazide (DYAZIDE) 37.5-25 MG capsule Take 1 capsule by mouth daily.     Allergies:   E-mycin [erythromycin], Avocado, Banana, Glucophage [metformin hcl], Kiwi extract, Mango flavor, Metoprolol, Xarelto [rivaroxaban], Levemir [insulin detemir], and Lexapro [escitalopram oxalate]   Social History   Socioeconomic History   Marital status: Married    Spouse name: Not on file   Number of children: Not on file   Years of education: Not on file   Highest education level: Not on file  Occupational History   Not on file  Tobacco Use   Smoking status: Never   Smokeless tobacco: Never  Vaping Use   Vaping Use: Never used  Substance and Sexual Activity   Alcohol use: Yes    Alcohol/week: 2.0 standard drinks    Types: 2 Cans of beer per week    Comment: rare   Drug use: No   Sexual activity: Not on file  Other Topics Concern   Not on file  Social History Narrative   Not on file   Social Determinants of Health   Financial Resource Strain: Not on file  Food Insecurity: Not on file  Transportation Needs: Not on file  Physical Activity: Not on file  Stress: Not on file  Social Connections: Not on file     Family History: The patient's family history includes Alzheimer's disease in an other family member; CAD in an other family member; Diabetes in an other family member; Kidney failure in her father.  ROS:   Please see the history of present illness.     All other systems reviewed and are negative.  EKGs/Labs/Other Studies Reviewed:     The following studies were reviewed today: Cardiac catheterization 02/09/2016 1. Angiographically normal coronary arteries 2. Elevated LVEDP  Echocardiogram 02/09/2016  - Left ventricle: The cavity size was normal. Wall thickness was    increased in a pattern of moderate LVH. Systolic function was    normal. The estimated ejection fraction was in the range of 60%    to 65%. Wall motion was normal; there were no regional wall    motion abnormalities. Doppler parameters are consistent with    abnormal left ventricular relaxation (grade 1 diastolic    dysfunction). The E/e&' ratio is >15, suggesting elevated LV    filling pressure.  - Aortic valve: Trileaflet. Sclerosis without stenosis. There was    no regurgitation.  - Mitral valve: Calcified annulus. Mildly thickened leaflets .  There was trivial regurgitation.  - Left atrium: The atrium was mildly dilated.  - Tricuspid valve: There was trivial regurgitation.  - Pulmonary arteries: PA peak pressure: 23 mm Hg (S).  - Inferior vena cava: The vessel was normal in size. The    respirophasic diameter changes were in the normal range (>= 50%),    consistent with normal central venous pressure.   Echocardiogram 04/25/2018 - Left ventricle: The cavity size was normal. Wall thickness was    increased in a pattern of mild LVH. Systolic function was    vigorous. The estimated ejection fraction was in the range of 65%    to 70%.  - Mitral valve: There was mild regurgitation.     EKG:  EKG is ordered today.  The ekg ordered today demonstrates normal sinus rhythm, nonspecific T wave changes, QTC 406 ms  Recent Labs: No results found for requested labs within last 8760 hours.  09/12//2022 Hemoglobin A1c 9.9% Creatinine 1.76, potassium 4.0  Recent Lipid Panel    Component Value Date/Time   CHOL 172 02/09/2016 0734   TRIG 94 02/09/2016 0734   HDL 72 02/09/2016 0734   CHOLHDL 2.4 02/09/2016 0734   VLDL 19 02/09/2016 0734    LDLCALC 81 02/09/2016 0734  08/23/2020 Cholesterol 128, HDL 63, LDL 50, triglycerides 73   Risk Assessment/Calculations:    CHA2DS2-VASc Score = 4   This indicates a 4.8% annual risk of stroke. The patient's score is based upon: CHF History: 1 HTN History: 1 Diabetes History: 1 Stroke History: 0 Vascular Disease History: 0 Age Score: 0 Gender Score: 1         Physical Exam:    VS:  BP 132/64 (BP Location: Left Arm, Patient Position: Sitting, Cuff Size: Large)   Pulse 78   Ht 5\' 2"  (1.575 m)   Wt 289 lb (131.1 kg)   SpO2 98%   BMI 52.86 kg/m     Wt Readings from Last 3 Encounters:  06/23/21 289 lb (131.1 kg)  06/01/21 289 lb (131.1 kg)  04/15/21 291 lb (132 kg)     GEN: Super morbidly obese well nourished, well developed in no acute distress HEENT: Normal NECK: Unable to evaluate the JVD; No carotid bruits LYMPHATICS: No lymphadenopathy CARDIAC: RRR, early peaking 1-2/6 aortic ejection murmur, no diastolic murmurs, rubs, gallops RESPIRATORY:  Clear to auscultation without rales, wheezing or rhonchi  ABDOMEN: Soft, non-tender, non-distended MUSCULOSKELETAL: Chronic brawny 3+ edema with tough scaly skin and dependent cyanosis; no deformity  SKIN: Warm and dry NEUROLOGIC:  Alert and oriented x 3 PSYCHIATRIC:  Normal affect   ASSESSMENT:    1. Paroxysmal atrial fibrillation (HCC)   2. Chronic right heart failure ([place])   3. OSA (obstructive sleep apnea)   4. Morbid obesity ([place])   5. Essential hypertension   6. Uncontrolled type 2 diabetes mellitus with hyperglycemia (HCC)   7. Stage 3a chronic kidney disease ([place])   8. Hypercholesterolemia   9. Long term (current) use of anticoagulants     PLAN:    In order of problems listed above:  AFib: Despite increasing with her COVID-19 infection, her episodes of A. fib are back to the previous baseline of rare episodes.  On a low-dose of carvedilol for rate control, which may not be sufficient (we will have to wait  until the next event to assess), may have had side effects with metoprolol.  Antiarrhythmics do not appear to be indicated.  Driver for atrial fibrillation is probably untreated  sleep apnea. CHF: Although some documentation of left ventricular diastolic dysfunction, clinically she has just right heart failure, likely due to obesity and sleep apnea.  NYHA functional class III primarily due to obesity and deconditioning.  Avoid aggressive diuresis due to the potential for worsening kidney function. OSA: We will schedule for in-home sleep study/CPAP titration of November 9, but I do not see the results of that test. Super-obesity: Hard to assess her dry weight due to this.  No overt signs of left heart failure, but she has chronic brawny edema of the lower extremities.  Superobesity contributes to her chronic medical problems including OSA, heart failure, diabetes, HTN and dyslipidemia. HTN: Fair control.  Continue same medications. DM: Needs nephrology follow-up.  She has retinopathy and nephropathy due to poorly controlled type 2 diabetes mellitus. CKD3a: Needs nephrology follow-up.  With proteinuria.  Due to diabetic nephropathy.  Back in 2020 her creatinine was 1.0.  In January 2022 creatinine was 1.33.  Since her COVID-19 infection in the spring her creatinine has been 1.61-1.81.   HLP: Despite hyperglycemia, her lipid parameters are pretty good.  Continue rosuvastatin. Anticoagulation: Denies falls or serious bleeding issues.        Medication Adjustments/Labs and Tests Ordered: Current medicines are reviewed at length with the patient today.  Concerns regarding medicines are outlined above.  No orders of the defined types were placed in this encounter.  No orders of the defined types were placed in this encounter.   Patient Instructions  Medication Instructions:  No changes *If you need a refill on your cardiac medications before your next appointment, please call your pharmacy*   Lab  Work: None ordered If you have labs (blood work) drawn today and your tests are completely normal, you will receive your results only by: [place] (if you have MyChart) OR A paper copy in the mail If you have any lab test that is abnormal or we need to change your treatment, we will call you to review the results.   Testing/Procedures: None ordered   Follow-Up: At [facility], you and your health needs are our priority.  As part of our continuing mission to provide you with exceptional heart care, we have created designated Provider Care Teams.  These Care Teams include your primary Cardiologist (physician) and Advanced Practice Providers (APPs -  Physician Assistants and Nurse Practitioners) who all work together to provide you with the care you need, when you need it.  We recommend signing up for the patient portal called "MyChart".  Sign up information is provided on this After Visit Summary.  MyChart is used to connect with patients for Virtual Visits (Telemedicine).  Patients are able to view lab/test results, encounter notes, upcoming appointments, etc.  Non-urgent messages can be sent to your provider as well.   To learn more about what you can do with MyChart, go to [email or web address].    Your next appointment:   6 month(s)  The format for your next appointment:   In Person  Provider:   Almyra Deforest, PA If MD is not listed, click here to update    :1}   Signed, [name], MD  06/25/2021 12:38 PM    [place]

## 2021-06-23 NOTE — Patient Instructions (Signed)
Medication Instructions:  No changes *If you need a refill on your cardiac medications before your next appointment, please call your pharmacy*   Lab Work: None ordered If you have labs (blood work) drawn today and your tests are completely normal, you will receive your results only by: [place] (if you have MyChart) OR A paper copy in the mail If you have any lab test that is abnormal or we need to change your treatment, we will call you to review the results.   Testing/Procedures: None ordered   Follow-Up: At [facility], you and your health needs are our priority.  As part of our continuing mission to provide you with exceptional heart care, we have created designated Provider Care Teams.  These Care Teams include your primary Cardiologist (physician) and Advanced Practice Providers (APPs -  Physician Assistants and Nurse Practitioners) who all work together to provide you with the care you need, when you need it.  We recommend signing up for the patient portal called "MyChart".  Sign up information is provided on this After Visit Summary.  MyChart is used to connect with patients for Virtual Visits (Telemedicine).  Patients are able to view lab/test results, encounter notes, upcoming appointments, etc.  Non-urgent messages can be sent to your provider as well.   To learn more about what you can do with MyChart, go to [email or web address].    Your next appointment:   6 month(s)  The format for your next appointment:   In Person  Provider:   Almyra Deforest, PA If MD is not listed, click here to update    :1}

## 2021-06-25 ENCOUNTER — Encounter: Payer: Self-pay | Admitting: Cardiovascular Disease

## 2021-09-28 ENCOUNTER — Other Ambulatory Visit: Payer: Self-pay

## 2021-09-28 ENCOUNTER — Ambulatory Visit (INDEPENDENT_AMBULATORY_CARE_PROVIDER_SITE_OTHER): Payer: 59 | Admitting: Podiatry

## 2021-09-28 DIAGNOSIS — B351 Tinea unguium: Secondary | ICD-10-CM

## 2021-09-28 DIAGNOSIS — M79674 Pain in right toe(s): Secondary | ICD-10-CM | POA: Diagnosis not present

## 2021-09-28 DIAGNOSIS — M79675 Pain in left toe(s): Secondary | ICD-10-CM | POA: Diagnosis not present

## 2021-09-28 DIAGNOSIS — E0843 Diabetes mellitus due to underlying condition with diabetic autonomic (poly)neuropathy: Secondary | ICD-10-CM | POA: Diagnosis not present

## 2021-09-28 NOTE — Progress Notes (Signed)
? ?  SUBJECTIVE ?Patient with a history of diabetes mellitus presents to office today complaining of elongated, thickened nails that cause pain while ambulating in shoes.  Patient is unable to trim their own nails. Patient is here for further evaluation and treatment. ? ? ?Past Medical History:  ?Diagnosis Date  ? Atrial fibrillation with RVR ([place])   ? Complication of anesthesia   ? had epidural for C-section and epidural did not take until after surgery  ? Depression   ? Diabetes mellitus without complication ([place])   ? GERD (gastroesophageal reflux disease)   ? Heart murmur   ? Hyperlipidemia   ? Hypertension   ? Obesity   ? Sleep apnea   ? Temporal arteritis ([place])   ? ? ?OBJECTIVE ?General Patient is awake, alert, and oriented x 3 and in no acute distress. ?Derm Skin is dry and supple bilateral. Negative open lesions or macerations. Remaining integument unremarkable. Nails are tender, long, thickened and dystrophic with subungual debris, consistent with onychomycosis, 1-5 bilateral. No signs of infection noted. ?Vasc  DP and PT pedal pulses palpable bilaterally. Temperature gradient within normal limits.  ?Neuro Epicritic and protective threshold sensation diminished bilaterally.  ?Musculoskeletal Exam No symptomatic pedal deformities noted bilateral. Muscular strength within normal limits. ? ?ASSESSMENT ?1. Diabetes Mellitus w/ peripheral neuropathy ?2.  Pain due to onychomycosis of toenails bilateral ? ?PLAN OF CARE ?1. Patient evaluated today.  Comprehensive diabetic foot evaluation performed today ?2. Instructed to maintain good pedal hygiene and foot care. Stressed importance of controlling blood sugar.  ?3. Mechanical debridement of nails 1-5 bilaterally performed using a nail nipper. Filed with dremel without incident.  ?4. Return to clinic in 3 mos. for routine foot care ? ? ? ?Edrick Kins, DPM ?[facility] [place] ? ?Dr. Edrick Kins, DPM  ?  ?[address]                                       ?[place], [place] [address]                ?Office [phone]  ?Fax [phone] ? ? ? ? ? ?

## 2022-01-02 ENCOUNTER — Encounter: Payer: Self-pay | Admitting: Podiatry

## 2022-01-02 ENCOUNTER — Ambulatory Visit (INDEPENDENT_AMBULATORY_CARE_PROVIDER_SITE_OTHER): Payer: 59 | Admitting: Podiatry

## 2022-01-02 DIAGNOSIS — Z91148 Patient's other noncompliance with medication regimen for other reason: Secondary | ICD-10-CM | POA: Insufficient documentation

## 2022-01-02 DIAGNOSIS — E0843 Diabetes mellitus due to underlying condition with diabetic autonomic (poly)neuropathy: Secondary | ICD-10-CM | POA: Diagnosis not present

## 2022-01-02 DIAGNOSIS — M79674 Pain in right toe(s): Secondary | ICD-10-CM | POA: Diagnosis not present

## 2022-01-02 DIAGNOSIS — L8995 Pressure ulcer of unspecified site, unstageable: Secondary | ICD-10-CM | POA: Diagnosis not present

## 2022-01-02 DIAGNOSIS — F3340 Major depressive disorder, recurrent, in remission, unspecified: Secondary | ICD-10-CM | POA: Insufficient documentation

## 2022-01-02 DIAGNOSIS — E1165 Type 2 diabetes mellitus with hyperglycemia: Secondary | ICD-10-CM | POA: Insufficient documentation

## 2022-01-02 DIAGNOSIS — B351 Tinea unguium: Secondary | ICD-10-CM

## 2022-01-02 DIAGNOSIS — M79675 Pain in left toe(s): Secondary | ICD-10-CM

## 2022-01-02 DIAGNOSIS — F331 Major depressive disorder, recurrent, moderate: Secondary | ICD-10-CM | POA: Insufficient documentation

## 2022-01-02 DIAGNOSIS — F324 Major depressive disorder, single episode, in partial remission: Secondary | ICD-10-CM | POA: Insufficient documentation

## 2022-01-02 DIAGNOSIS — N184 Chronic kidney disease, stage 4 (severe): Secondary | ICD-10-CM | POA: Insufficient documentation

## 2022-01-02 DIAGNOSIS — E113519 Type 2 diabetes mellitus with proliferative diabetic retinopathy with macular edema, unspecified eye: Secondary | ICD-10-CM | POA: Insufficient documentation

## 2022-01-02 DIAGNOSIS — I890 Lymphedema, not elsewhere classified: Secondary | ICD-10-CM | POA: Insufficient documentation

## 2022-01-02 DIAGNOSIS — Z794 Long term (current) use of insulin: Secondary | ICD-10-CM | POA: Insufficient documentation

## 2022-01-02 DIAGNOSIS — K5900 Constipation, unspecified: Secondary | ICD-10-CM | POA: Insufficient documentation

## 2022-01-02 DIAGNOSIS — H401131 Primary open-angle glaucoma, bilateral, mild stage: Secondary | ICD-10-CM | POA: Insufficient documentation

## 2022-01-02 DIAGNOSIS — D509 Iron deficiency anemia, unspecified: Secondary | ICD-10-CM | POA: Insufficient documentation

## 2022-01-02 DIAGNOSIS — Z8371 Family history of colonic polyps: Secondary | ICD-10-CM | POA: Insufficient documentation

## 2022-01-02 DIAGNOSIS — E559 Vitamin D deficiency, unspecified: Secondary | ICD-10-CM | POA: Insufficient documentation

## 2022-01-02 DIAGNOSIS — E11319 Type 2 diabetes mellitus with unspecified diabetic retinopathy without macular edema: Secondary | ICD-10-CM | POA: Insufficient documentation

## 2022-01-02 DIAGNOSIS — L89609 Pressure ulcer of unspecified heel, unspecified stage: Secondary | ICD-10-CM | POA: Insufficient documentation

## 2022-01-02 DIAGNOSIS — F332 Major depressive disorder, recurrent severe without psychotic features: Secondary | ICD-10-CM | POA: Insufficient documentation

## 2022-01-02 DIAGNOSIS — E1121 Type 2 diabetes mellitus with diabetic nephropathy: Secondary | ICD-10-CM | POA: Insufficient documentation

## 2022-01-02 DIAGNOSIS — E113513 Type 2 diabetes mellitus with proliferative diabetic retinopathy with macular edema, bilateral: Secondary | ICD-10-CM | POA: Insufficient documentation

## 2022-01-02 DIAGNOSIS — R809 Proteinuria, unspecified: Secondary | ICD-10-CM | POA: Insufficient documentation

## 2022-01-02 MED ORDER — CEPHALEXIN 500 MG PO CAPS: 500.0000 mg | ORAL_CAPSULE | Freq: Three times a day (TID) | ORAL | 0 refills | Status: DC

## 2022-01-02 NOTE — Patient Instructions (Addendum)
Choose a moisturizer from  the list below:  For extremely dry, cracked feet: moisturize feet once daily; do not apply between toes A. CeraVe Healing Ointment B. Eucerin Aquaphor Repairing Ointment (may be labeled Aquaphor Healing Ointment)  If you have problems reaching your feet: apply to feet once daily; do not apply between toes A.  Eucerin Aquaphor Ointment Body Spray  B.  Vaseline Intensive Care Spray Moisturizer (Unscented,  [place] Radiant Spray or Aloe Smooth Spray)  Pressure Injury  A pressure injury is damage to the skin and underlying tissue that results from pressure being applied to an area of the body. It often affects people who must spend a long time in a bed or chair because of a medical condition. Pressure injuries usually occur: Over bony parts of the body, such as the tailbone, shoulders, elbows, hips, heels, spine, ankles, and back of the head. Under medical devices that make contact with the body, such as respiratory equipment, stockings, tubes, and splints. Pressure injuries start as reddened areas on the skin and can lead to pain and an open wound. What are the causes? This condition is caused by frequent or constant pressure to an area of the body. Decreased blood flow to the skin can eventually cause the skin tissue to die and break down, causing a wound. What increases the risk? You are more likely to develop this condition if you: Are in the hospital or an extended care facility. Are bedridden or in a wheelchair. Have an injury or disease that keeps you from: Moving normally. Feeling pain or pressure. Have a condition that: Makes you sleepy or less alert. Causes poor blood flow. Need to wear a medical device. Have poor control of your bladder or bowel functions (incontinence). Have poor nutrition (malnutrition). If you are at risk for pressure injuries, your health care provider may recommend certain types of mattresses, mattress covers, pillows, cushions, or  boots to help prevent them. These may include products filled with air, foam, gel, or sand. What are the signs or symptoms? Symptoms of this condition depend on the severity of the injury. Symptoms may include: Red or dark areas of the skin. Pain, warmth, or a change of skin texture. Blisters. An open wound. How is this diagnosed? This condition is diagnosed with a medical history and physical exam. You may also have tests, such as: Blood tests. Imaging tests. Blood flow tests. Your pressure injury will be staged based on its severity. Staging is based on: The depth of the tissue injury, including whether there is exposure of muscle, bone, or tendon. The cause of the pressure injury. How is this treated? This condition may be treated by: Relieving or redistributing pressure on your skin. This includes: Frequently changing your position. Avoiding positions that caused the wound or that can make the wound worse. Using specific bed mattresses, chair cushions, or protective boots. Moving medical devices from an area of pressure, or placing padding between the skin and the device. Using foams, creams, or powders to prevent rubbing (friction) on the skin. Keeping your skin clean and dry. This may include using a skin cleanser or skin barrier as told by your health care provider. Cleaning your injury and removing any dead tissue from the wound (debridement). Placing a bandage (dressing) over your injury. Using medicines for pain or to prevent or treat infection. Surgery may be needed if other treatments are not working or if your injury is very deep. Follow these instructions at home: Wound care Follow instructions  from your health care provider about how to take care of your wound. Make sure you: Wash your hands with soap and water before and after you change your bandage (dressing). If soap and water are not available, use hand sanitizer. Change your dressing as told by your health care  provider.  Check your wound every day for signs of infection. Have a caregiver do this for you if you are not able. Check for: Redness, swelling, or increased pain. More fluid or blood. Warmth. Pus or a bad smell. Skin care Keep your skin clean and dry. Gently pat your skin dry. Do not rub or massage your skin. You or a caregiver should check your skin every day for any changes in color or any new blisters or sores (ulcers). Medicines Take over-the-counter and prescription medicines only as told by your health care provider. If you were prescribed an antibiotic medicine, take or apply it as told by your health care provider. Do not stop using the antibiotic even if your condition improves. Reducing and redistributing pressure Do not lie or sit in one position for a long time. Move or change position every 1-2 hours, or as told by your health care provider. Use pillows or cushions to reduce pressure. Ask your health care provider to recommend cushions or pads for you. General instructions  Eat a healthy diet that includes lots of protein. Drink enough fluid to keep your urine pale yellow. Be as active as you can every day. Ask your health care provider to suggest safe exercises or activities. Do not abuse drugs or alcohol. Do not use any products that contain nicotine or tobacco, such as cigarettes, e-cigarettes, and chewing tobacco. If you need help quitting, ask your health care provider. Keep all follow-up visits as told by your health care provider. This is important. Contact a health care provider if: You have: A fever or chills. Pain that is not helped by medicine. Any changes in skin color. New blisters or sores. Pus or a bad smell coming from your wound. Redness, swelling, or pain around your wound. More fluid or blood coming from your wound. Your wound does not improve after 1-2 weeks of treatment. Summary A pressure injury is damage to the skin and underlying tissue that  results from pressure being applied to an area of the body. Do not lie or sit in one position for a long time. Your health care provider may advise you to move or change position every 1-2 hours. Follow instructions from your health care provider about how to take care of your wound. Keep all follow-up visits as told by your health care provider. This is important. This information is not intended to replace advice given to you by your health care provider. Make sure you discuss any questions you have with your health care provider. Document Revised: 05/05/2021 Document Reviewed: 05/05/2021 Elsevier Patient Education  [place].

## 2022-01-08 NOTE — Progress Notes (Signed)
  Subjective:  Patient ID: Angela [name], female    DOB: [birth year],  MRN: [id number]  Alferd Apa presents to clinic today for at risk foot care with history of diabetic neuropathy and painful elongated mycotic toenails 1-5 bilaterally which are tender when wearing enclosed shoe gear. Pain is relieved with periodic professional debridement.  Last known HgA1c was 8.0%. Patient did not check blood glucose today.  New problem(s):   Patient relates pain of left heel for the past two months. States she sleeps in a recliner most of the time. She rests her feet on a pillow. Heel is tender to touch. She denies walking barefoot.  PCP is Cari Caraway, MD , and last visit was May, 2023.  Allergies  Allergen Reactions   E-Mycin [Erythromycin] Nausea And Vomiting   Avocado Diarrhea and Nausea And Vomiting   Banana Diarrhea and Nausea And Vomiting   Glucophage [Metformin Hcl] Diarrhea and Nausea And Vomiting   Kiwi Extract Diarrhea and Nausea And Vomiting   Mango Flavor Diarrhea   Metoprolol     Unclear, patient was also started on Xarelto, developed severe itching and LE weakness, stopped both and symptoms resolved   Xarelto [Rivaroxaban]     Unclear, patient was also started on Metoprolol, developed severe itching/LE weakness, stopped both and her and symptoms resolved   Diltiazem Hcl Rash   Levemir [Insulin Detemir] Rash   Lexapro [Escitalopram Oxalate] Itching and Other (See Comments)    Numb lips    Review of Systems: Negative except as noted in the HPI.  Objective: No changes noted in today's physical examination.  Vascular Examination: Vascular status intact b/l with palpable pedal pulses. Pedal hair sparse b/l. CFT immediate b/l. No edema. No pain with calf compression b/l. Skin temperature gradient WNL b/l.   Neurological Examination: Pt has subjective symptoms of neuropathy. Protective sensation diminished with 10g monofilament b/l.  Dermatological Examination: Moderately  dry skin noted b/l. Toenails 1-5 b/l thick, discolored, elongated with subungual debris and pain on dorsal palpation. No hyperkeratotic lesions noted b/l.   Area of pressure noted posteromedial aspect of left heel with tenderness to palpation. Area erythematous. No blister formation, no eschar, no edema, no underlying fluctuance.  Musculoskeletal Examination: Muscle strength 5/5 to b/l LE. No gross bony deformities bilaterally.  Radiographs: None  Assessment/Plan: 1. Pain due to onychomycosis of toenails of both feet   2. Pressure injury, unstageable, with suspected deep tissue injury ([place])   3. Diabetes mellitus due to underlying condition with diabetic autonomic neuropathy, unspecified whether long term insulin use ([place])     -Patient was evaluated and treated. All patient's and/or POA's questions/concerns answered on today's visit. -Discussed pressure injury left heel. Recommended strict offloading. Dispensed heel protector. ABN signed. -Dispensed list of moisturizers for dry skin. -Mycotic toenails 1-5 bilaterally were debrided in length and girth with sterile nail nippers and dremel without incident. -Rx for Keflex 500 mg po, #30, to be taken one capsule three times daily for 10 days. -Patient scheduled to see Dr. Lorenda Peck in two weeks for follow up of pressure ulcer left heel. -Patient/POA to call should there be question/concern in the interim.   Return in about 3 months (around 04/04/2022).  Marzetta Board, DPM

## 2022-01-17 ENCOUNTER — Ambulatory Visit: Payer: 59 | Admitting: Podiatry

## 2022-02-23 ENCOUNTER — Other Ambulatory Visit: Payer: Self-pay | Admitting: Internal Medicine

## 2022-02-23 DIAGNOSIS — N184 Chronic kidney disease, stage 4 (severe): Secondary | ICD-10-CM

## 2022-04-06 ENCOUNTER — Emergency Department (HOSPITAL_COMMUNITY): Payer: 59

## 2022-04-06 ENCOUNTER — Encounter (HOSPITAL_COMMUNITY): Payer: Self-pay | Admitting: Emergency Medicine

## 2022-04-06 ENCOUNTER — Observation Stay (HOSPITAL_COMMUNITY)
Admission: EM | Admit: 2022-04-06 | Discharge: 2022-04-07 | Disposition: A | Discharge status: Home / Self Care | Payer: 59 | Attending: Internal Medicine | Admitting: Internal Medicine

## 2022-04-06 ENCOUNTER — Other Ambulatory Visit: Payer: Self-pay

## 2022-04-06 DIAGNOSIS — I480 Paroxysmal atrial fibrillation: Secondary | ICD-10-CM | POA: Insufficient documentation

## 2022-04-06 DIAGNOSIS — Z8616 Personal history of COVID-19: Secondary | ICD-10-CM | POA: Diagnosis not present

## 2022-04-06 DIAGNOSIS — E1122 Type 2 diabetes mellitus with diabetic chronic kidney disease: Secondary | ICD-10-CM | POA: Diagnosis not present

## 2022-04-06 DIAGNOSIS — Z79899 Other long term (current) drug therapy: Secondary | ICD-10-CM | POA: Diagnosis not present

## 2022-04-06 DIAGNOSIS — I130 Hypertensive heart and chronic kidney disease with heart failure and stage 1 through stage 4 chronic kidney disease, or unspecified chronic kidney disease: Secondary | ICD-10-CM | POA: Diagnosis not present

## 2022-04-06 DIAGNOSIS — Z7901 Long term (current) use of anticoagulants: Secondary | ICD-10-CM | POA: Diagnosis not present

## 2022-04-06 DIAGNOSIS — I5031 Acute diastolic (congestive) heart failure: Secondary | ICD-10-CM | POA: Diagnosis not present

## 2022-04-06 DIAGNOSIS — E7800 Pure hypercholesterolemia, unspecified: Secondary | ICD-10-CM | POA: Diagnosis present

## 2022-04-06 DIAGNOSIS — G4733 Obstructive sleep apnea (adult) (pediatric): Secondary | ICD-10-CM | POA: Diagnosis present

## 2022-04-06 DIAGNOSIS — N184 Chronic kidney disease, stage 4 (severe): Secondary | ICD-10-CM | POA: Insufficient documentation

## 2022-04-06 DIAGNOSIS — D649 Anemia, unspecified: Principal | ICD-10-CM | POA: Insufficient documentation

## 2022-04-06 DIAGNOSIS — N390 Urinary tract infection, site not specified: Secondary | ICD-10-CM

## 2022-04-06 DIAGNOSIS — I10 Essential (primary) hypertension: Secondary | ICD-10-CM | POA: Diagnosis present

## 2022-04-06 DIAGNOSIS — Z794 Long term (current) use of insulin: Secondary | ICD-10-CM | POA: Diagnosis not present

## 2022-04-06 DIAGNOSIS — R079 Chest pain, unspecified: Secondary | ICD-10-CM | POA: Diagnosis present

## 2022-04-06 DIAGNOSIS — N179 Acute kidney failure, unspecified: Secondary | ICD-10-CM | POA: Diagnosis not present

## 2022-04-06 DIAGNOSIS — R0600 Dyspnea, unspecified: Secondary | ICD-10-CM

## 2022-04-06 LAB — MAGNESIUM: Magnesium: 2.3 mg/dL (ref 1.7–2.4)

## 2022-04-06 LAB — CBC WITH DIFFERENTIAL/PLATELET
Abs Immature Granulocytes: 0.04 10*3/uL (ref 0.00–0.07)
Basophils Absolute: 0 10*3/uL (ref 0.0–0.1)
Basophils Relative: 0 %
Eosinophils Absolute: 0.2 10*3/uL (ref 0.0–0.5)
Eosinophils Relative: 2 %
HCT: 24.8 % — ABNORMAL LOW (ref 36.0–46.0)
Hemoglobin: 7.4 g/dL — ABNORMAL LOW (ref 12.0–15.0)
Immature Granulocytes: 1 %
Lymphocytes Relative: 15 %
Lymphs Abs: 1.2 10*3/uL (ref 0.7–4.0)
MCH: 27.7 pg (ref 26.0–34.0)
MCHC: 29.8 g/dL — ABNORMAL LOW (ref 30.0–36.0)
MCV: 92.9 fL (ref 80.0–100.0)
Monocytes Absolute: 0.5 10*3/uL (ref 0.1–1.0)
Monocytes Relative: 6 %
Neutro Abs: 6.2 10*3/uL (ref 1.7–7.7)
Neutrophils Relative %: 76 %
Platelets: 199 10*3/uL (ref 150–400)
RBC: 2.67 MIL/uL — ABNORMAL LOW (ref 3.87–5.11)
RDW: 14.8 % (ref 11.5–15.5)
WBC: 8.1 10*3/uL (ref 4.0–10.5)
nRBC: 0 % (ref 0.0–0.2)

## 2022-04-06 LAB — COMPREHENSIVE METABOLIC PANEL
ALT: 12 U/L (ref 0–44)
AST: 14 U/L — ABNORMAL LOW (ref 15–41)
Albumin: 3.4 g/dL — ABNORMAL LOW (ref 3.5–5.0)
Alkaline Phosphatase: 55 U/L (ref 38–126)
Anion gap: 11 (ref 5–15)
BUN: 40 mg/dL — ABNORMAL HIGH (ref 8–23)
CO2: 22 mmol/L (ref 22–32)
Calcium: 9.5 mg/dL (ref 8.9–10.3)
Chloride: 110 mmol/L (ref 98–111)
Creatinine, Ser: 2.87 mg/dL — ABNORMAL HIGH (ref 0.44–1.00)
GFR, Estimated: 18 mL/min — ABNORMAL LOW (ref >60)
Glucose, Bld: 184 mg/dL — ABNORMAL HIGH (ref 70–99)
Potassium: 4.2 mmol/L (ref 3.5–5.1)
Sodium: 143 mmol/L (ref 135–145)
Total Bilirubin: 0.7 mg/dL (ref 0.3–1.2)
Total Protein: 7.3 g/dL (ref 6.5–8.1)

## 2022-04-06 LAB — PROTIME-INR
INR: 1.4 — ABNORMAL HIGH (ref 0.8–1.2)
Prothrombin Time: 17 seconds — ABNORMAL HIGH (ref 11.4–15.2)

## 2022-04-06 LAB — TSH: TSH: 1.362 u[IU]/mL (ref 0.350–4.500)

## 2022-04-06 LAB — URINALYSIS, ROUTINE W REFLEX MICROSCOPIC
Bilirubin Urine: NEGATIVE
Glucose, UA: >=500 mg/dL — AB
Ketones, ur: NEGATIVE mg/dL
Nitrite: NEGATIVE
Protein, ur: NEGATIVE mg/dL
Specific Gravity, Urine: 1.012 (ref 1.005–1.030)
pH: 6 (ref 5.0–8.0)

## 2022-04-06 LAB — TROPONIN I (HIGH SENSITIVITY)
Troponin I (High Sensitivity): 12 ng/L (ref <18)
Troponin I (High Sensitivity): 12 ng/L (ref <18)

## 2022-04-06 LAB — FERRITIN: Ferritin: 72 ng/mL (ref 11–307)

## 2022-04-06 LAB — PREPARE RBC (CROSSMATCH)

## 2022-04-06 LAB — HIV ANTIBODY (ROUTINE TESTING W REFLEX): HIV Screen 4th Generation wRfx: NONREACTIVE

## 2022-04-06 LAB — IRON AND TIBC
Iron: 64 ug/dL (ref 28–170)
Saturation Ratios: 18 % (ref 10.4–31.8)
TIBC: 356 ug/dL (ref 250–450)
UIBC: 292 ug/dL

## 2022-04-06 LAB — BRAIN NATRIURETIC PEPTIDE: B Natriuretic Peptide: 230.3 pg/mL — ABNORMAL HIGH (ref 0.0–100.0)

## 2022-04-06 LAB — GLUCOSE, CAPILLARY: Glucose-Capillary: 116 mg/dL — ABNORMAL HIGH (ref 70–99)

## 2022-04-06 LAB — APTT: aPTT: 31 seconds (ref 24–36)

## 2022-04-06 LAB — POC OCCULT BLOOD, ED: Fecal Occult Bld: NEGATIVE

## 2022-04-06 LAB — ABO/RH: ABO/RH(D): A POS

## 2022-04-06 MED ORDER — ACETAMINOPHEN 650 MG RE SUPP: 650.0000 mg | Freq: Four times a day (QID) | RECTAL | Status: DC | PRN

## 2022-04-06 MED ORDER — LACTATED RINGERS IV BOLUS: 500.0000 mL | Freq: Once | INTRAVENOUS | Status: DC

## 2022-04-06 MED ORDER — SODIUM CHLORIDE 0.9% FLUSH: 3.0000 mL | INTRAVENOUS | Status: DC | PRN

## 2022-04-06 MED ORDER — SODIUM CHLORIDE 0.9% FLUSH
3.0000 mL | Freq: Two times a day (BID) | INTRAVENOUS | Status: DC
  Administered 2022-04-06 – 2022-04-07 (×2): 3 mL via INTRAVENOUS

## 2022-04-06 MED ORDER — INSULIN ASPART 100 UNIT/ML IJ SOLN: 0.0000 [IU] | Freq: Three times a day (TID) | INTRAMUSCULAR | Status: DC

## 2022-04-06 MED ORDER — ACETAMINOPHEN 325 MG PO TABS: 650.0000 mg | ORAL_TABLET | Freq: Four times a day (QID) | ORAL | Status: DC | PRN

## 2022-04-06 MED ORDER — DORZOLAMIDE HCL-TIMOLOL MAL 2-0.5 % OP SOLN
1.0000 [drp] | Freq: Every day | OPHTHALMIC | Status: DC
  Filled 2022-04-06: qty 10

## 2022-04-06 MED ORDER — FUROSEMIDE 10 MG/ML IJ SOLN
20.0000 mg | Freq: Once | INTRAMUSCULAR | Status: AC
  Administered 2022-04-06: 20 mg via INTRAVENOUS
  Filled 2022-04-06: qty 2

## 2022-04-06 MED ORDER — SODIUM CHLORIDE 0.9% IV SOLUTION: Freq: Once | INTRAVENOUS | Status: DC

## 2022-04-06 MED ORDER — SODIUM CHLORIDE 0.9 % IV SOLN
1.0000 g | Freq: Once | INTRAVENOUS | Status: AC
  Administered 2022-04-06: 1 g via INTRAVENOUS
  Filled 2022-04-06: qty 10

## 2022-04-06 MED ORDER — CARVEDILOL PHOSPHATE ER 10 MG PO CP24
10.0000 mg | ORAL_CAPSULE | Freq: Every day | ORAL | Status: DC
  Administered 2022-04-06 – 2022-04-07 (×2): 10 mg via ORAL
  Filled 2022-04-06 (×2): qty 1

## 2022-04-06 MED ORDER — SODIUM CHLORIDE 0.9 % IV SOLN: 250.0000 mL | INTRAVENOUS | Status: DC | PRN

## 2022-04-06 MED ORDER — LACTATED RINGERS IV BOLUS
1000.0000 mL | Freq: Once | INTRAVENOUS | Status: AC
  Administered 2022-04-06: 1000 mL via INTRAVENOUS

## 2022-04-06 MED ORDER — AMLODIPINE BESYLATE 10 MG PO TABS
10.0000 mg | ORAL_TABLET | Freq: Every day | ORAL | Status: DC
  Administered 2022-04-06 – 2022-04-07 (×2): 10 mg via ORAL
  Filled 2022-04-06: qty 1
  Filled 2022-04-06: qty 2

## 2022-04-06 MED ORDER — ROSUVASTATIN CALCIUM 20 MG PO TABS
20.0000 mg | ORAL_TABLET | Freq: Every day | ORAL | Status: DC
  Administered 2022-04-06 – 2022-04-07 (×2): 20 mg via ORAL
  Filled 2022-04-06 (×2): qty 1

## 2022-04-06 NOTE — Assessment & Plan Note (Signed)
Continue crestor 

## 2022-04-06 NOTE — Assessment & Plan Note (Signed)
Followed by Dr. Johnney Ou Recent labs on 8/2 showed creatinine of 2.03 UA Strict I/O Will give one time dose of lasix 20mg  and hold other nephrotoxic drugs Transfuse 1 unite RBC Trend

## 2022-04-06 NOTE — Progress Notes (Signed)
Secure chat  conversation with Anderson Malta RN re PIV.  Pt has adequate PIV access for blood transfusion.  Stated wanted PIV for lab draw.  Notified IV team does not start PIV for lab draws and physician needed to be notified.

## 2022-04-06 NOTE — Assessment & Plan Note (Signed)
In NSR, continue coreg Holding eliquis in setting of acute on chronic anemia for the evening

## 2022-04-06 NOTE — Assessment & Plan Note (Signed)
Noncompliant with her cpap, discussed risks related to untreated OSA.  Will order qhs while here

## 2022-04-06 NOTE — Assessment & Plan Note (Addendum)
61 year old female here with complaints of progressing worse shortness of breath, chest pain and anemia from her pcp office -observation to telemetry -hgb 7.4 today, it was 8.2 on 02/22/22 -fecal occult negative with no other bleeding. She is on eliquis -likely secondary to CKD -transfusing one unit PRBC, consent obtained -cbc post transfusion  -hold eliquis tonight  -has colonoscopy scheduled but has to be done at [facility] so still waiting on getting this scheduled.

## 2022-04-06 NOTE — ED Notes (Signed)
Patient reports being very uncomfortable in the stretcher making her lower bottom hurt.   Updated patient about room status.

## 2022-04-06 NOTE — ED Notes (Signed)
Admitting provider notified about patient oxygen levels dropping to 85-87% and requiring oxygen.

## 2022-04-06 NOTE — H&P (Signed)
History and Physical    Patient: Angela [name]:[id number] DOB: 07/06/1961 DOA: 04/06/2022 DOS: the patient was seen and examined on 04/06/2022 PCP: Cari Caraway, MD  Patient coming from: Home - lives with her husband. Occasionally uses cane.    Chief Complaint: chest pain and shortness of breath   HPI: Angela [name] is a 61 y.o. female with medical history significant of HTN, HLD, OSA, GERD, T2DM, depression, atrial fibrillation, CKD stage 3 who presented to ED with complaints of chest pain and shortness of breath. She states symptoms started last year after she had covid and has progressively gotten worse. She states today when she started to move around and she felt like her chest pain and shortness of breath were worse than normal and it prompted her to come to ED. She states her PCP did blood work Monday and told her she was anemic and wanted her to come in today to do more blood work. Her chest pain can be intermittent, but today pain was worse with movement. No radiation and it's reproducible.  She has had no dark or bloody stools. She has no N/V/D. She feels like she has more swelling in his legs. She first noticed this when she changed her blood pressure medication and then when she put her on farxiga, she really "swelled up." She generally sleeps sitting up. She denies bleeding from anywhere else.   She is on eliquis and last took this last night.  Hx of heavy NSAID use in the past, but no longer uses. Colonoscopy she states 10 years ago, husband says 5 years. Clean. Actually scheduled to have repeat colonoscopy, but having to do at [facility] due to he afib. Family history of colon cancer in her mother.    Denies any fever/chills, vision changes/headaches, palpitations,cough, abdominal pain, N/V/D, dysuria or leg swelling.    She does not smoke or drink alcohol.   ER Course:  vitals: afebrile, bp: 132/63, HR: 64, RR: 20, oxygen: 98%RA Pertinent labs: hgb: 7.4, BUN: 40, creatinine:  2.87, bnp: 230, fecal occult negative  CXR: cardiomegaly, no acute changes.  In ED: given 1L IVF and rocephin. TRH asked to admit.    Review of Systems: As mentioned in the history of present illness. All other systems reviewed and are negative. Past Medical History:  Diagnosis Date   Atrial fibrillation with RVR ([place])    Complication of anesthesia    had epidural for C-section and epidural did not take until after surgery   Depression    Diabetes mellitus without complication (HCC)    GERD (gastroesophageal reflux disease)    Heart murmur    Hyperlipidemia    Hypertension    Obesity    Sleep apnea    Temporal arteritis ([place])    Past Surgical History:  Procedure Laterality Date   BREAST SURGERY     bilateral breast reduction   CARDIAC CATHETERIZATION N/A 02/09/2016   Procedure: Left Heart Cath and Coronary Angiography;  Surgeon: Sherren Mocha, MD;  Location: [place] [facility];  Service: Cardiovascular;  Laterality: N/A;   CESAREAN SECTION     COLONOSCOPY     TUBAL LIGATION     Social History:  reports that she has never smoked. She has never used smokeless tobacco. She reports current alcohol use of about 2.0 standard drinks of alcohol per week. She reports that she does not use drugs.  Allergies  Allergen Reactions   E-Mycin [Erythromycin] Nausea And Vomiting   Avocado Diarrhea and  Nausea And Vomiting   Banana Diarrhea and Nausea And Vomiting   Glucophage [Metformin Hcl] Diarrhea and Nausea And Vomiting   Kiwi Extract Diarrhea and Nausea And Vomiting   Mango Flavor Diarrhea   Metoprolol     Unclear, patient was also started on Xarelto, developed severe itching and LE weakness, stopped both and symptoms resolved   Xarelto [Rivaroxaban]     Unclear, patient was also started on Metoprolol, developed severe itching/LE weakness, stopped both and her and symptoms resolved   Diltiazem Hcl Rash   Levemir [Insulin Detemir] Rash   Lexapro [Escitalopram Oxalate] Itching and  Other (See Comments)    Numb lips    Family History  Problem Relation Age of Onset   Kidney failure Father    CAD Other        Several aunts had MIs   Alzheimer's disease Other    Diabetes Other     Prior to Admission medications   Medication Sig Start Date End Date Taking? Authorizing Provider  amLODipine (NORVASC) 5 MG tablet Take 5 mg by mouth daily. 06/10/21   [provider]  apixaban (ELIQUIS) 5 MG TABS tablet Take 1 tablet (5 mg total) by mouth 2 (two) times daily. 03/13/16   Baldwin Jamaica, PA-C  carvedilol (COREG CR) 10 MG 24 hr capsule Take 10 mg by mouth daily. 12/15/21   [provider]  carvedilol (COREG) 3.125 MG tablet Take 3.125 mg by mouth 2 (two) times daily with a meal.    [provider]  cephALEXin (KEFLEX) 500 MG capsule Take 1 capsule (500 mg total) by mouth 3 (three) times daily. 01/02/22   Marzetta Board, DPM  diltiazem (CARDIZEM) 30 MG tablet 1 tab as needed for onset of atrial fibrillation    [provider]  dorzolamide-timolol (COSOPT) 22.3-6.8 MG/ML ophthalmic solution Place 1 drop into both eyes at bedtime. 04/17/19   [provider]  DULoxetine (CYMBALTA) 60 MG capsule 1 capsule    [provider]  esomeprazole (NEXIUM) 20 MG capsule Take 40 mg by mouth daily in the afternoon.  Patient not taking: Reported on 06/23/2021    [provider]  gatifloxacin (ZYMAXID) 0.5 % SOLN Place 1 drop into the right eye 3 (three) times daily. 09/26/21   [provider]  glimepiride (AMARYL) 4 MG tablet Take 1 tablet by mouth daily with breakfast.     [provider]  Insulin Glargine (BASAGLAR KWIKPEN) 100 UNIT/ML Inject 50 Units into the skin 2 (two) times daily.    [provider]  ketorolac (ACULAR) 0.5 % ophthalmic solution Place 1 drop into the right eye 4 (four) times daily. 09/26/21   [provider]  lisinopril (PRINIVIL,ZESTRIL) 40 MG tablet Take 40 mg by mouth  daily. 03/22/16   [provider]  lisinopril (ZESTRIL) 10 MG tablet Take 10 mg by mouth daily. 11/15/21   [provider]  metFORMIN (GLUCOPHAGE-XR) 500 MG 24 hr tablet Take 1,000 mg by mouth daily. 08/27/21   [provider]  pravastatin (PRAVACHOL) 40 MG tablet Take 40 mg by mouth daily. 11/15/21   [provider]  rosuvastatin (CRESTOR) 20 MG tablet Take 20 mg by mouth daily.    [provider]  triamcinolone ointment (KENALOG) 0.1 % 1 application 08/16/56   [provider]  triamterene-hydrochlorothiazide (DYAZIDE) 37.5-25 MG capsule Take 1 capsule by mouth daily.    [provider]  Vitamin D, Ergocalciferol, (DRISDOL) 1.25 MG (50000 UNIT) CAPS capsule Take  50,000 Units by mouth once a week. 04/21/21   [provider]    Physical Exam: Vitals:   04/06/22 1600 04/06/22 1615 04/06/22 1845 04/06/22 1926  BP: (!) 147/96 (!) 156/64  (!) 146/63  Pulse: 71 64  69  Resp: 18 (!) 28  (!) 21  Temp:   98.2 F (36.8 C) 98.1 F (36.7 C)  TempSrc:   Oral Oral  SpO2: 97% 99%  98%  Weight:      Height:       General:  Appears calm and comfortable and is in NAD. obese Eyes:  PERRL, EOMI, normal lids, iris ENT:  grossly normal hearing, lips & tongue, mmm; appropriate dentition Neck:  no LAD, masses or thyromegaly; no carotid bruits,. No JVD Cardiovascular:  RRR, no m/r/g. Bilateral LE edema with chronic skin changes  Respiratory:   CTA bilaterally with no wheezes/rales/rhonchi.  Normal respiratory effort. Abdomen:  soft, NT, ND, NABS Back:   normal alignment, no CVAT Skin:  no rash or induration seen on limited exam Musculoskeletal:  grossly normal tone BUE/BLE, good ROM, no bony abnormality Lower extremity:  No LE edema.  Limited foot exam with no ulcerations.  2+ distal pulses. Psychiatric:  grossly normal mood and affect, speech fluent and appropriate, AOx3 Neurologic:  CN 2-12 grossly intact, moves all extremities in  coordinated fashion, sensation intact   Radiological Exams on Admission: Independently reviewed - see discussion in A/P where applicable  DG Chest 2 View  Result Date: 04/06/2022 CLINICAL DATA:  Chest pain, shortness of breath EXAM: CHEST - 2 VIEW COMPARISON:  Previous studies including the examination of 07/16/2019 FINDINGS: Transverse diameter of heart is increased. There are no signs of alveolar pulmonary edema. Faint reticulonodular densities are seen in both lungs with no significant change. There is no focal pulmonary consolidation. There is no pleural effusion or pneumothorax. IMPRESSION: Cardiomegaly. There are no signs of pulmonary edema. Reticulonodular densities in both lungs have not changed significantly, possibly suggesting chronic interstitial lung disease. There are no new focal infiltrates. There is no pleural effusion or pneumothorax. Electronically Signed   By: Elmer Picker M.D.   On: 04/06/2022 13:25    EKG: Independently reviewed.  NSR with rate 67; nonspecific ST changes with no evidence of acute ischemia   Labs on Admission: I have personally reviewed the available labs and imaging studies at the time of the admission.  Pertinent labs:   hgb: 7.4,  BUN: 40,  creatinine: 2.87, bnp: 230,  fecal occult negative   Assessment and Plan: Principal Problem:   Symptomatic acute on chronic anemia Active Problems:   Acute renal failure superimposed on stage 4 chronic kidney disease (HCC)   Acute diastolic CHF (congestive heart failure) (HCC)   Essential hypertension   Paroxysmal atrial fibrillation (HCC)   Type 2 diabetes mellitus with chronic kidney disease, with long-term current use of insulin (HCC)   Hypercholesterolemia   OSA (obstructive sleep apnea)    Assessment and Plan: * Symptomatic acute on chronic anemia 61 year old female here with complaints of progressing worse shortness of breath, chest pain and anemia from her pcp office -observation to  telemetry -hgb 7.4 today, it was 8.2 on 02/22/22 -fecal occult negative with no other bleeding. She is on eliquis -likely secondary to CKD -transfusing one unit PRBC, consent obtained -cbc post transfusion  -hold eliquis tonight  -has colonoscopy scheduled but has to be done at [facility] so still waiting on getting this scheduled.   Acute renal  failure superimposed on stage 4 chronic kidney disease ([place]) Followed by Dr. Johnney Ou Recent labs on 8/2 showed creatinine of 2.03 UA Strict I/O Will give one time dose of lasix 20mg  and hold other nephrotoxic drugs Transfuse 1 unite RBC Trend   Acute diastolic CHF (congestive heart failure) (HCC) Not convinced she has an acute exacerbation of CHF; however, symptoms are hard to determine from CHF vs. Anemia vs. CKD.  BNP slightly elevated, but could be from her anemia, CKD and untreated OSA CXR does not appear edematous Will give 20mg  of lasix x1 dose before blood transfusion Strict I/O Repeat echo   Essential hypertension Continue home norvasc 10mg  daily and coreg Hold her hygroton   Paroxysmal atrial fibrillation (HCC) In NSR, continue coreg Holding eliquis in setting of acute on chronic anemia for the evening   Type 2 diabetes mellitus with chronic kidney disease, with long-term current use of insulin ([place]) Patient reported her a1c was 7.0 a few months ago Repeat pending  Continue long acting insulin Hold amaryl  SSI and accuchecks qac/hs  Hypercholesterolemia Continue crestor   OSA (obstructive sleep apnea) Noncompliant with her cpap, discussed risks related to untreated OSA.  Will order qhs while here    Advance Care Planning:   Code Status: Full Code   Consults: none   DVT Prophylaxis: SCDs   Family Communication: husband at bedside: Marchia [place]   Severity of Illness: The appropriate patient status for this patient is OBSERVATION. Observation status is judged to be reasonable and necessary in order to provide the  required intensity of service to ensure the patient's safety. The patient's presenting symptoms, physical exam findings, and initial radiographic and laboratory data in the context of their medical condition is felt to place them at decreased risk for further clinical deterioration. Furthermore, it is anticipated that the patient will be medically stable for discharge from the hospital within 2 midnights of admission.   Author: Orma Flaming, MD 04/06/2022 7:54 PM  For on call review [email or web address].

## 2022-04-06 NOTE — Assessment & Plan Note (Signed)
Continue home norvasc 10mg  daily and coreg Hold her hygroton

## 2022-04-06 NOTE — ED Notes (Signed)
Internal Medicine paged for an unassigned admission consult per Dr. Lenard Forth request

## 2022-04-06 NOTE — ED Provider Notes (Signed)
[facility] DEPART[facility] Provider Note   CSN: [id number] Arrival date & time: 04/06/22  1228     History  Chief Complaint  Patient presents with   Chest Pain    KMYA PLACIDE is a 61 y.o. female.  With PMH of A-fib on Eliquis, CHF, CKD, HTN, HLD who presents with worsening shortness of breath on exertion associated with lower extremity swelling and chest pain on exertion.  She also endorses just generalized fatigue and weakness upon ambulating.  This has been ongoing and worsening over the past couple weeks.  She is says this is all related to starting  [place]. She went to her PCP offices today and was found to be anemic and with her concern for chest pain and shortness of breath was sent to the ER for further evaluation.  She denies any fevers, cough, orthopnea, PND.  She did note some mild weight gain.  She denies any melena or hematochezia or vaginal bleeding or hematuria.  She pees on her own and is not on dialysis.  She has history of CKD.  She is not on any diuretics.   Chest Pain      Home Medications Prior to Admission medications   Medication Sig Start Date End Date Taking? Authorizing Provider  amLODipine (NORVASC) 10 MG tablet Take 10 mg by mouth daily. 02/23/22  Yes [provider]  apixaban (ELIQUIS) 5 MG TABS tablet Take 1 tablet (5 mg total) by mouth 2 (two) times daily. 03/13/16  Yes Baldwin Jamaica, PA-C  carvedilol (COREG CR) 10 MG 24 hr capsule Take 10 mg by mouth daily. 12/15/21  Yes [provider]  chlorthalidone (HYGROTON) 25 MG tablet Take 12.5 mg by mouth daily. 02/23/22  Yes [provider]  dorzolamide-timolol (COSOPT) 22.3-6.8 MG/ML ophthalmic solution Place 1 drop into both eyes at bedtime. 04/17/19  Yes [provider]  FARXIGA 10 MG TABS tablet Take 10 mg by mouth daily. 03/30/22  Yes [provider]  glimepiride (AMARYL) 4 MG tablet Take 1 tablet by mouth daily with breakfast.    Yes  [provider]  Insulin Glargine (BASAGLAR KWIKPEN) 100 UNIT/ML Inject 50 Units into the skin 2 (two) times daily.   Yes [provider]  rosuvastatin (CRESTOR) 20 MG tablet Take 20 mg by mouth daily.   Yes [provider]  Vitamin D, Ergocalciferol, (DRISDOL) 1.25 MG (50000 UNIT) CAPS capsule Take 50,000 Units by mouth once a week. 04/21/21  Yes [provider]      Allergies    E-mycin [erythromycin], Avocado, Banana, Glucophage [metformin hcl], Kiwi extract, Mango flavor, Metoprolol, Xarelto [rivaroxaban], Diltiazem hcl, Levemir [insulin detemir], and Lexapro [escitalopram oxalate]    Review of Systems   Review of Systems  Cardiovascular:  Positive for chest pain.    Physical Exam Updated Vital Signs BP (!) 146/63   Pulse 69   Temp 98.1 F (36.7 C) (Oral)   Resp (!) 21   Ht 5\' 2"  (1.575 m)   Wt 128.8 kg   SpO2 98%   BMI 51.94 kg/m  Physical Exam Constitutional: Alert and oriented.  Fatigued but nontoxic Eyes: Conjunctivae are normal. ENT      Head: Normocephalic and atraumatic.      Nose: No congestion.      Mouth/Throat: Mucous membranes are moist.      Neck: No stridor. Cardiovascular: S1, S2, regular rate, equal palpable radial pulses Respiratory: Normal respiratory effort. Breath sounds are normal.  O2  sat 100 on RA. Gastrointestinal: Soft and nontender.  No melena or hematochezia on rectal exam.   Musculoskeletal: Normal range of motion in all extremities. Bilateral equal nontender edema of the lower extremities with venous stasis changes of the distal extremities Neurologic: Normal speech and language.  Moving all extremities equally.  No facial droop.  No gross focal neurologic deficits are appreciated. Skin: Skin is warm,  Psychiatric: Mood and affect are normal. Speech and behavior are normal.  ED Results / Procedures / Treatments   Labs (all labs ordered are listed, but only abnormal results are displayed) Labs Reviewed   COMPREHENSIVE METABOLIC PANEL - Abnormal; Notable for the following components:      Result Value   Glucose, Bld 184 (*)    BUN 40 (*)    Creatinine, Ser 2.87 (*)    Albumin 3.4 (*)    AST 14 (*)    GFR, Estimated 18 (*)    All other components within normal limits  CBC WITH DIFFERENTIAL/PLATELET - Abnormal; Notable for the following components:   RBC 2.67 (*)    Hemoglobin 7.4 (*)    HCT 24.8 (*)    MCHC 29.8 (*)    All other components within normal limits  BRAIN NATRIURETIC PEPTIDE - Abnormal; Notable for the following components:   B Natriuretic Peptide 230.3 (*)    All other components within normal limits  URINALYSIS, ROUTINE W REFLEX MICROSCOPIC - Abnormal; Notable for the following components:   APPearance HAZY (*)    Glucose, UA >=500 (*)    Hgb urine dipstick SMALL (*)    Leukocytes,Ua TRACE (*)    Bacteria, UA MANY (*)    All other components within normal limits  PROTIME-INR - Abnormal; Notable for the following components:   Prothrombin Time 17.0 (*)    INR 1.4 (*)    All other components within normal limits  URINE CULTURE  APTT  FERRITIN  MAGNESIUM  TSH  IRON AND TIBC  HEMOGLOBIN A1C  SODIUM, URINE, RANDOM  CREATININE, URINE, RANDOM  HIV ANTIBODY (ROUTINE TESTING W REFLEX)  BASIC METABOLIC PANEL  CBC  POC OCCULT BLOOD, ED  TYPE AND SCREEN  ABO/RH  PREPARE RBC (CROSSMATCH)  TROPONIN I (HIGH SENSITIVITY)  TROPONIN I (HIGH SENSITIVITY)    EKG EKG Interpretation  Date/Time:  Thursday April 06 2022 12:37:41 EDT Ventricular Rate:  67 PR Interval:  160 QRS Duration: 84 QT Interval:  414 QTC Calculation: 437 R Axis:   54 Text Interpretation: Sinus rhythm Low voltage, precordial leads Borderline T wave abnormalities Nonspecific T wave flattening inferior and lateral leads Confirmed by Georgina Snell [phone]) on 04/06/2022 1:01:15 PM  Radiology DG Chest 2 View  Result Date: 04/06/2022 CLINICAL DATA:  Chest pain, shortness of breath EXAM:  CHEST - 2 VIEW COMPARISON:  Previous studies including the examination of 07/16/2019 FINDINGS: Transverse diameter of heart is increased. There are no signs of alveolar pulmonary edema. Faint reticulonodular densities are seen in both lungs with no significant change. There is no focal pulmonary consolidation. There is no pleural effusion or pneumothorax. IMPRESSION: Cardiomegaly. There are no signs of pulmonary edema. Reticulonodular densities in both lungs have not changed significantly, possibly suggesting chronic interstitial lung disease. There are no new focal infiltrates. There is no pleural effusion or pneumothorax. Electronically Signed   By: Elmer Picker M.D.   On: 04/06/2022 13:25    Procedures Procedures  Remained on constant cardiac monitoring, normal sinus rhythm.  Medications Ordered in ED Medications  insulin aspart (novoLOG) injection 0-9 Units (has no administration in time range)  0.9 %  sodium chloride infusion (Manually program via Guardrails IV Fluids) (has no administration in time range)  sodium chloride flush (NS) 0.9 % injection 3 mL (has no administration in time range)  sodium chloride flush (NS) 0.9 % injection 3 mL (has no administration in time range)  0.9 %  sodium chloride infusion (has no administration in time range)  acetaminophen (TYLENOL) tablet 650 mg (has no administration in time range)    Or  acetaminophen (TYLENOL) suppository 650 mg (has no administration in time range)  amLODipine (NORVASC) tablet 10 mg (10 mg Oral Given 04/06/22 1911)  carvedilol (COREG CR) 24 hr capsule 10 mg (10 mg Oral Given 04/06/22 1910)  rosuvastatin (CRESTOR) tablet 20 mg (20 mg Oral Given 04/06/22 1911)  dorzolamide-timolol (COSOPT) 22.3-6.8 MG/ML ophthalmic solution 1 drop (has no administration in time range)  cefTRIAXone (ROCEPHIN) 1 g in sodium chloride 0.9 % 100 mL IVPB (0 g Intravenous Stopped 04/06/22 1606)  lactated ringers bolus 1,000 mL (0 mLs Intravenous Stopped  04/06/22 1651)  furosemide (LASIX) injection 20 mg (20 mg Intravenous Given 04/06/22 1911)    ED Course/ Medical Decision Making/ A&P                           Medical Decision Making RODNESHIA GREENHOUSE is a 61 y.o. female.  With PMH of A-fib on Eliquis, CHF, CKD, HTN, HLD who presents with worsening shortness of breath on exertion associated with lower extremity swelling and chest pain on exertion.  She also endorses just generalized fatigue and weakness upon ambulating.    Based on patient's symptoms and history and physical exam, lower suspicion for atypical ACS.  Her EKG obtained here was sinus rhythm with nonspecific T wave changes that looks similar to previous EKGs and improved.  Her troponin was 12 and reassuring.  She does have lower extremity edema with venous stasis changes but a chest x-ray obtained with no evidence of pleural effusions or pulmonary edema nor does she have an oxygen requirement.  BNP slightly elevated at 230.  However I do not think she is severely fluid overloaded or worsening heart failure. Her lab work was more concerning for a new anemia hemoglobin 7.4 that is normocytic and down from a baseline of 10-11.  She denies any bleeding and her Hemoccult was negative.  I suspect it could also be more secondary to chronic kidney disease her creatinine was elevated 2.87 from 1.05 obtained 2 years prior significant for an AKI.  BUN to creatinine ratio 13.93 likely mixed picture from + UTI, underlying heart failure, hypertension and diabetes.  Patient receiving IV Rocephin for UTI and IV fluids.  Paging hospitalist for admission for further work-up and management requiring further work-up of new anemia, possible continued fluids for AKI, repeat echocardiogram and treatment for UTI.    Amount and/or Complexity of Data Reviewed Labs: ordered. Radiology: ordered.  Risk Decision regarding hospitalization.    Final Clinical Impression(s) / ED Diagnoses Final diagnoses:  Anemia,  unspecified type  Chest pain, unspecified type  Dyspnea, unspecified type  Urinary tract infection without hematuria, site unspecified  AKI (acute kidney injury) [facility])    Rx / DC Orders ED Discharge Orders     None         Elgie Congo, MD 04/06/22 1937

## 2022-04-06 NOTE — ED Notes (Signed)
Informed Dr. Nechama Guard about tele alarm

## 2022-04-06 NOTE — Assessment & Plan Note (Signed)
Not convinced she has an acute exacerbation of CHF; however, symptoms are hard to determine from CHF vs. Anemia vs. CKD.  BNP slightly elevated, but could be from her anemia, CKD and untreated OSA CXR does not appear edematous Will give 20mg  of lasix x1 dose before blood transfusion Strict I/O Repeat echo

## 2022-04-06 NOTE — ED Triage Notes (Signed)
Pt to ER via EMS form MD office with c/o chest pain and SHOB for several weeks.  Pt was at PCP for follow up labs which were not completed.

## 2022-04-06 NOTE — Assessment & Plan Note (Signed)
Patient reported her a1c was 7.0 a few months ago Repeat pending  Continue long acting insulin Hold amaryl  SSI and accuchecks qac/hs

## 2022-04-06 NOTE — ED Notes (Signed)
Blood started

## 2022-04-07 ENCOUNTER — Observation Stay (HOSPITAL_COMMUNITY): Payer: 59

## 2022-04-07 ENCOUNTER — Observation Stay (HOSPITAL_BASED_OUTPATIENT_CLINIC_OR_DEPARTMENT_OTHER): Payer: 59

## 2022-04-07 DIAGNOSIS — D649 Anemia, unspecified: Secondary | ICD-10-CM | POA: Diagnosis not present

## 2022-04-07 DIAGNOSIS — I5031 Acute diastolic (congestive) heart failure: Secondary | ICD-10-CM

## 2022-04-07 LAB — GLUCOSE, CAPILLARY
Glucose-Capillary: 108 mg/dL — ABNORMAL HIGH (ref 70–99)
Glucose-Capillary: 107 mg/dL — ABNORMAL HIGH (ref 70–99)

## 2022-04-07 LAB — CBC
HCT: 26.9 % — ABNORMAL LOW (ref 36.0–46.0)
Hemoglobin: 8.5 g/dL — ABNORMAL LOW (ref 12.0–15.0)
MCH: 27.8 pg (ref 26.0–34.0)
MCHC: 31.6 g/dL (ref 30.0–36.0)
MCV: 87.9 fL (ref 80.0–100.0)
Platelets: 214 10*3/uL (ref 150–400)
RBC: 3.06 MIL/uL — ABNORMAL LOW (ref 3.87–5.11)
RDW: 15.4 % (ref 11.5–15.5)
WBC: 9.1 10*3/uL (ref 4.0–10.5)
nRBC: 0 % (ref 0.0–0.2)

## 2022-04-07 LAB — BASIC METABOLIC PANEL
Anion gap: 12 (ref 5–15)
BUN: 36 mg/dL — ABNORMAL HIGH (ref 8–23)
CO2: 23 mmol/L (ref 22–32)
Calcium: 9.8 mg/dL (ref 8.9–10.3)
Chloride: 109 mmol/L (ref 98–111)
Creatinine, Ser: 2.63 mg/dL — ABNORMAL HIGH (ref 0.44–1.00)
GFR, Estimated: 20 mL/min — ABNORMAL LOW (ref >60)
Glucose, Bld: 120 mg/dL — ABNORMAL HIGH (ref 70–99)
Potassium: 4 mmol/L (ref 3.5–5.1)
Sodium: 144 mmol/L (ref 135–145)

## 2022-04-07 LAB — ECHOCARDIOGRAM COMPLETE
AR max vel: 1.94 cm2
AV Area VTI: 1.94 cm2
AV Area mean vel: 2.07 cm2
AV Mean grad: 9 mmHg
AV Peak grad: 17.5 mmHg
Ao pk vel: 2.09 m/s
Area-P 1/2: 3.2 cm2
Calc EF: 63.8 %
Height: 62 in
MV M vel: 2.63 m/s
MV Peak grad: 27.7 mmHg
MV VTI: 1.56 cm2
S' Lateral: 2.85 cm
Single Plane A2C EF: 69.8 %
Single Plane A4C EF: 60.5 %
Weight: 4867.76 oz

## 2022-04-07 LAB — BPAM RBC
Blood Product Expiration Date: 202310122359
ISSUE DATE / TIME: 202309141913
Unit Type and Rh: 6200

## 2022-04-07 LAB — TYPE AND SCREEN
ABO/RH(D): A POS
Antibody Screen: NEGATIVE
Unit division: 0

## 2022-04-07 LAB — HEMOGLOBIN A1C
Hgb A1c MFr Bld: 6.7 % — ABNORMAL HIGH (ref 4.8–5.6)
Mean Plasma Glucose: 145.59 mg/dL

## 2022-04-07 NOTE — Discharge Summary (Signed)
Physician Discharge Summary  Angela [name] ZOX:[id number] DOB: 10-20-1960 DOA: 04/06/2022  PCP: Angela Caraway, MD  Admit date: 04/06/2022 Discharge date: 04/07/2022  Admitted From: Home Disposition: Home  Recommendations for Outpatient Follow-up:  Follow up with PCP in 1-2 weeks Follow up with cardiology sleep study as discussed  Home Health: None Equipment/Devices: None  Discharge Condition: Stable CODE STATUS: Full Diet recommendation: Low-salt low-fat low-carb diet  Brief/Interim Summary: Angela [name] is a 61 y.o. female with medical history significant of HTN, HLD, OSA, GERD, T2DM, depression, atrial fibrillation, CKD stage 3 who presented to ED with complaints of chest pain and shortness of breath.   Patient found to have profound anemia, thought to have symptomatic anemia which did improve with transfusion confirming diagnosis.  Patient otherwise back to baseline today denies nausea vomiting diarrhea constipation headache fevers chills chest pain or shortness of breath.  Lengthy discussion at bedside about sleep apnea diagnosis but no machine at home.  She tolerated CPAP here quite well, would recommend close follow-up with PCP and cardiologist for further discussion.  Symptomatic acute on chronic anemia -Hemoglobin back to baseline, symptoms resolved as above   Acute renal failure superimposed on stage 4 chronic kidney disease ([place]) Improving with transfusion, increase p.o. intake -follow-up labs with PCP next week  Diastolic CHF (congestive heart failure) ([place]) Not in acute exacerbations Patient euvolemic, ambulating without dyspnea, no orthopnea. Again symptoms resolved with transfusion. Repeat echo performed, follow-up with cardiology for evaluation    Essential hypertension Resume home medications, no changes   Paroxysmal atrial fibrillation ([place]) Continue home medications, no changes Okay to continue Eliquis, no signs or symptoms of bleeding at this  time  Type 2 diabetes mellitus with chronic kidney disease, with long-term current use of insulin ([name]) Continue home regimen   Hypercholesterolemia Continue crestor    OSA (obstructive sleep apnea) Noncompliant with her cpap(reportedly never received machine), discussed risks related to untreated OSA.  Will order qhs while here  Discharge Diagnoses:  Principal Problem:   Symptomatic acute on chronic anemia Active Problems:   Acute renal failure superimposed on stage 4 chronic kidney disease (HCC)   Acute diastolic CHF (congestive heart failure) (HCC)   Essential hypertension   Paroxysmal atrial fibrillation (HCC)   Type 2 diabetes mellitus with chronic kidney disease, with long-term current use of insulin (HCC)   Hypercholesterolemia   OSA (obstructive sleep apnea)   Discharge Instructions   Allergies as of 04/07/2022       Reactions   E-mycin [erythromycin] Nausea And Vomiting   Avocado Diarrhea, Nausea And Vomiting   Banana Diarrhea, Nausea And Vomiting   Glucophage [metformin Hcl] Diarrhea, Nausea And Vomiting   Kiwi Extract Diarrhea, Nausea And Vomiting   Mango Flavor Diarrhea   Metoprolol    Unclear, patient was also started on Xarelto, developed severe itching and LE weakness, stopped both and symptoms resolved   Xarelto [rivaroxaban]    Unclear, patient was also started on Metoprolol, developed severe itching/LE weakness, stopped both and her and symptoms resolved   Diltiazem Hcl Rash   Levemir [insulin Detemir] Rash   Lexapro [escitalopram Oxalate] Itching, Other (See Comments)   Numb lips        Medication List     TAKE these medications    amLODipine 10 MG tablet Commonly known as: NORVASC Take 10 mg by mouth daily.   apixaban 5 MG Tabs tablet Commonly known as: ELIQUIS Take 1 tablet (5 mg total) by mouth 2 (two) times daily.  Basaglar KwikPen 100 UNIT/ML Inject 50 Units into the skin 2 (two) times daily.   carvedilol 10 MG 24 hr  capsule Commonly known as: COREG CR Take 10 mg by mouth daily.   chlorthalidone 25 MG tablet Commonly known as: HYGROTON Take 12.5 mg by mouth daily.   dorzolamide-timolol 22.3-6.8 MG/ML ophthalmic solution Commonly known as: COSOPT Place 1 drop into both eyes at bedtime.   Farxiga 10 MG Tabs tablet Generic drug: dapagliflozin propanediol Take 10 mg by mouth daily.   glimepiride 4 MG tablet Commonly known as: AMARYL Take 1 tablet by mouth daily with breakfast.   rosuvastatin 20 MG tablet Commonly known as: CRESTOR Take 20 mg by mouth daily.   Vitamin D (Ergocalciferol) 1.25 MG (50000 UNIT) Caps capsule Commonly known as: DRISDOL Take 50,000 Units by mouth once a week.        Allergies  Allergen Reactions   E-Mycin [Erythromycin] Nausea And Vomiting   Avocado Diarrhea and Nausea And Vomiting   Banana Diarrhea and Nausea And Vomiting   Glucophage [Metformin Hcl] Diarrhea and Nausea And Vomiting   Kiwi Extract Diarrhea and Nausea And Vomiting   Mango Flavor Diarrhea   Metoprolol     Unclear, patient was also started on Xarelto, developed severe itching and LE weakness, stopped both and symptoms resolved   Xarelto [Rivaroxaban]     Unclear, patient was also started on Metoprolol, developed severe itching/LE weakness, stopped both and her and symptoms resolved   Diltiazem Hcl Rash   Levemir [Insulin Detemir] Rash   Lexapro [Escitalopram Oxalate] Itching and Other (See Comments)    Numb lips    Consultations: None   Procedures/Studies: ECHOCARDIOGRAM COMPLETE  Result Date: 04/07/2022    ECHOCARDIOGRAM REPORT   Patient Name:   Angela [name]: 04/07/2022 Medical Rec #:  [id number]       Height:       62.0 in Accession #:    [id number]      Weight:       304.2 lb Date of Birth:  03/13/1961       BSA:          2.285 m Patient Age:    61 years        BP:           134/57 mmHg Patient Gender: F               HR:           68 bpm. [name] Location:  Inpatient  Procedure: 2D Echo Indications:    CHF  History:        Patient has prior history of Echocardiogram examinations, most                 recent 04/25/2018. CHF, Arrythmias:Atrial Fibrillation; Risk                 Factors:Hypertension and Diabetes.  Sonographer:    Angela [name] Referring Phys: [id number] [facility]  Sonographer Comments: Technically difficult study due to poor echo windows and patient is obese. Image acquisition challenging due to patient body habitus. IMPRESSIONS  1. Left ventricular ejection fraction, by estimation, is 65 to 70%. The left ventricle has normal function. The left ventricle has no regional wall motion abnormalities. There is mild concentric left ventricular hypertrophy. Left ventricular diastolic function could not be evaluated.  2. Right ventricular systolic function was not well visualized. The right ventricular size is not well visualized. There is mildly  elevated pulmonary artery systolic pressure.  3. The mitral valve is grossly normal. Trivial mitral valve regurgitation. No evidence of mitral stenosis. Moderate mitral annular calcification.  4. The aortic valve is grossly normal. There is mild calcification of the aortic valve. There is mild thickening of the aortic valve. Aortic valve regurgitation is not visualized. Aortic valve sclerosis is present, with no evidence of aortic valve stenosis.  5. The inferior vena cava is dilated in size with <50% respiratory variability, suggesting right atrial pressure of 15 mmHg. Comparison(s): No significant change from prior study. Conclusion(s)/Recommendation(s): Technically difficult study, no significant changes compared to prior study. FINDINGS  Left Ventricle: Left ventricular ejection fraction, by estimation, is 65 to 70%. The left ventricle has normal function. The left ventricle has no regional wall motion abnormalities. The left ventricular internal cavity size was normal in size. There is  mild concentric left ventricular  hypertrophy. Left ventricular diastolic function could not be evaluated. Right Ventricle: The right ventricular size is not well visualized. Right vetricular wall thickness was not well visualized. Right ventricular systolic function was not well visualized. There is mildly elevated pulmonary artery systolic pressure. The tricuspid regurgitant velocity is 2.39 m/s, and with an assumed right atrial pressure of 15 mmHg, the estimated right ventricular systolic pressure is 94.8 mmHg. Left Atrium: Left atrial size was normal in size. Right Atrium: Right atrial size was not well visualized. Pericardium: There is no evidence of pericardial effusion. Mitral Valve: The mitral valve is grossly normal. Moderate mitral annular calcification. Trivial mitral valve regurgitation. No evidence of mitral valve stenosis. MV peak gradient, 9.6 mmHg. The mean mitral valve gradient is 3.0 mmHg. Tricuspid Valve: The tricuspid valve is normal in structure. Tricuspid valve regurgitation is trivial. No evidence of tricuspid stenosis. Aortic Valve: The aortic valve is grossly normal. There is mild calcification of the aortic valve. There is mild thickening of the aortic valve. Aortic valve regurgitation is not visualized. Aortic valve sclerosis is present, with no evidence of aortic valve stenosis. Aortic valve mean gradient measures 9.0 mmHg. Aortic valve peak gradient measures 17.5 mmHg. Aortic valve area, by VTI measures 1.94 cm. Pulmonic Valve: The pulmonic valve was not well visualized. Pulmonic valve regurgitation is trivial. Aorta: The aortic root, ascending aorta, aortic arch and descending aorta are all structurally normal, with no evidence of dilitation or obstruction. Venous: The inferior vena cava is dilated in size with less than 50% respiratory variability, suggesting right atrial pressure of 15 mmHg. IAS/Shunts: The interatrial septum was not well visualized.  LEFT VENTRICLE PLAX 2D LVIDd:         4.30 cm LVIDs:         2.85  cm LV PW:         1.10 cm LV IVS:        1.10 cm LVOT diam:     1.80 cm LV SV:         84 LV SV Index:   37 LVOT Area:     2.54 cm  LV Volumes (MOD) LV vol d, MOD A2C: 61.3 ml LV vol d, MOD A4C: 57.4 ml LV vol s, MOD A2C: 18.5 ml LV vol s, MOD A4C: 22.7 ml LV SV MOD A2C:     42.8 ml LV SV MOD A4C:     57.4 ml LV SV MOD BP:      38.0 ml LEFT ATRIUM             Index LA diam:  3.70 cm 1.62 cm/m LA Vol (A2C):   60.8 ml 26.61 ml/m LA Vol (A4C):   55.5 ml 24.29 ml/m LA Biplane Vol: 59.5 ml 26.04 ml/m  AORTIC VALVE                     PULMONIC VALVE AV Area (Vmax):    1.94 cm      PV Vmax:          1.39 m/s AV Area (Vmean):   2.07 cm      PV Peak grad:     7.7 mmHg AV Area (VTI):     1.94 cm      PR End Diast Vel: 3.13 msec AV Vmax:           209.00 cm/s AV Vmean:          138.000 cm/s AV VTI:            0.435 m AV Peak Grad:      17.5 mmHg AV Mean Grad:      9.0 mmHg LVOT Vmax:         159.00 cm/s LVOT Vmean:        112.000 cm/s LVOT VTI:          0.331 m LVOT/AV VTI ratio: 0.76  AORTA Ao Root diam: 3.30 cm Ao Asc diam:  3.30 cm MITRAL VALVE                TRICUSPID VALVE MV Area (PHT): 3.20 cm     TR Peak grad:   22.8 mmHg MV Area VTI:   1.56 cm     TR Vmax:        239.00 cm/s MV Peak grad:  9.6 mmHg MV Mean grad:  3.0 mmHg     SHUNTS MV Vmax:       1.55 m/s     Systemic VTI:  0.33 m MV Vmean:      81.4 cm/s    Systemic Diam: 1.80 cm MV Decel Time: 237 msec MR Peak grad: 27.7 mmHg MR Vmax:      263.00 cm/s MV E velocity: 129.00 cm/s MV A velocity: 127.00 cm/s MV E/A ratio:  1.02 Buford Dresser MD Electronically signed by Buford Dresser MD Signature Date/Time: 04/07/2022/3:26:53 PM    Final    DG CHEST PORT 1 VIEW  Result Date: 04/07/2022 CLINICAL DATA:  Shortness of breath. [name]: PORTABLE CHEST 1 VIEW COMPARISON:  04/06/2022. FINDINGS: The heart is enlarged and the mediastinal contour stable. The pulmonary vasculature is distended. Diffuse patchy airspace disease is noted bilaterally. No  effusion or pneumothorax. No acute osseous abnormality. IMPRESSION: 1. Cardiomegaly with pulmonary vascular congestion. 2. Patchy airspace disease bilaterally, possible pulmonary edema versus infection. Electronically Signed   By: Brett Fairy M.D.   On: 04/07/2022 00:42   DG Chest 2 View  Result Date: 04/06/2022 CLINICAL DATA:  Chest pain, shortness of breath [name]: CHEST - 2 VIEW COMPARISON:  Previous studies including the examination of 07/16/2019 FINDINGS: Transverse diameter of heart is increased. There are no signs of alveolar pulmonary edema. Faint reticulonodular densities are seen in both lungs with no significant change. There is no focal pulmonary consolidation. There is no pleural effusion or pneumothorax. IMPRESSION: Cardiomegaly. There are no signs of pulmonary edema. Reticulonodular densities in both lungs have not changed significantly, possibly suggesting chronic interstitial lung disease. There are no new focal infiltrates. There is no pleural effusion or pneumothorax. Electronically Signed   By: Royston Cowper  Rathinasamy M.D.   On: 04/06/2022 13:25     Subjective: No acute issues or events overnight, symptoms resolved with transfusion otherwise stable for discharge   Discharge [name]: Vitals:   04/07/22 0513 04/07/22 0831  BP: (!) 134/57 133/67  Pulse: 65 66  Resp: 20 19  Temp: 98.3 F (36.8 C) 98.5 F (36.9 C)  SpO2: 100% 100%   Vitals:   04/06/22 2209 04/07/22 0451 04/07/22 0513 04/07/22 0831  BP: (!) 153/66  (!) 134/57 133/67  Pulse: 67 70 65 66  Resp: 18 18 20 19   Temp: 98.4 F (36.9 C)  98.3 F (36.8 C) 98.5 F (36.9 C)  TempSrc: Oral  Oral Axillary  SpO2: 99% 99% 100% 100%  Weight:   (!) 138 kg   Height:        General: Pt is alert, awake, not in acute distress Cardiovascular: RRR, S1/S2 +, no rubs, no gallops Respiratory: CTA bilaterally, no wheezing, no rhonchi Abdominal: Soft, NT, ND, bowel sounds + Extremities: no edema, chronic skin changes bilateral  lower extremities chronic edema   The results of significant diagnostics from this hospitalization (including imaging, microbiology, ancillary and laboratory) are listed below for reference.     Microbiology: No results found for this or any previous visit (from the past 240 hour(s)).   Labs: BNP (last 3 results) Recent Labs    04/06/22 1320  BNP 284.1*   Basic Metabolic Panel: Recent Labs  Lab 04/06/22 1320 04/06/22 1804 04/07/22 0047  NA 143  --  144  K 4.2  --  4.0  CL 110  --  109  CO2 22  --  23  GLUCOSE 184*  --  120*  BUN 40*  --  36*  CREATININE 2.87*  --  2.63*  CALCIUM 9.5  --  9.8  MG  --  2.3  --    Liver Function Tests: Recent Labs  Lab 04/06/22 1320  AST 14*  ALT 12  ALKPHOS 55  BILITOT 0.7  PROT 7.3  ALBUMIN 3.4*   No results for input(s): "LIPASE", "AMYLASE" in the last 168 hours. No results for input(s): "AMMONIA" in the last 168 hours. CBC: Recent Labs  Lab 04/06/22 1320 04/07/22 0047  WBC 8.1 9.1  NEUTROABS 6.2  --   HGB 7.4* 8.5*  HCT 24.8* 26.9*  MCV 92.9 87.9  PLT 199 214   Cardiac Enzymes: No results for input(s): "CKTOTAL", "CKMB", "CKMBINDEX", "TROPONINI" in the last 168 hours. BNP: Invalid input(s): "POCBNP" CBG: Recent Labs  Lab 04/06/22 2224 04/07/22 0834 04/07/22 1134  GLUCAP 116* 108* 107*   D-Dimer No results for input(s): "DDIMER" in the last 72 hours. Hgb A1c Recent Labs    04/07/22 0047  HGBA1C 6.7*   Lipid Profile No results for input(s): "CHOL", "HDL", "LDLCALC", "TRIG", "CHOLHDL", "LDLDIRECT" in the last 72 hours. Thyroid function studies Recent Labs    04/06/22 1804  TSH 1.362   Anemia work up Recent Labs    04/06/22 1717 04/06/22 1804  FERRITIN  --  72  TIBC 356  --   IRON 64  --    Urinalysis    Component Value Date/Time   COLORURINE YELLOW 04/06/2022 1300   APPEARANCEUR HAZY (A) 04/06/2022 1300   LABSPEC 1.012 04/06/2022 1300   PHURINE 6.0 04/06/2022 1300   GLUCOSEU >=500 (A)  04/06/2022 1300   HGBUR SMALL (A) 04/06/2022 1300   BILIRUBINUR NEGATIVE 04/06/2022 [place] 04/06/2022 [place] 04/06/2022  [place] 04/06/2022 1300   LEUKOCYTESUR TRACE (A) 04/06/2022 1300   Sepsis Labs Recent Labs  Lab 04/06/22 1320 04/07/22 0047  WBC 8.1 9.1   Microbiology No results found for this or any previous visit (from the past 240 hour(s)).   Time coordinating discharge: Over 30 minutes  SIGNED:   Little Ishikawa, DO [facility] 04/07/2022, 6:05 PM Pager   If 7PM-7AM, please contact night-coverage [email or web address]

## 2022-04-07 NOTE — TOC CM/SW Note (Signed)
  Transition of Care (TOC) Screening Note   Patient Details  Name: Angela [name] Date of Birth: 1960/08/09     Transition of Care Department [facility]) has reviewed patient and no TOC needs have been identified at this time. We will continue to monitor patient advancement through interdisciplinary progression rounds. If new patient transition needs arise, please place a TOC consult.

## 2022-04-07 NOTE — Progress Notes (Signed)
Pt placed on CPAP w/ 3L bleed in. Rt will cont to monitor as needed.

## 2022-04-07 NOTE — Progress Notes (Signed)
SATURATION QUALIFICATIONS: (This note is used to comply with regulatory documentation for home oxygen)  Patient Saturations on Room Air at Rest = 94%  Patient Saturations on Room Air while Ambulating = 90%  Angela [name]

## 2022-04-07 NOTE — Progress Notes (Signed)
Mobility Specialist - Progress Note   04/07/22 1233  Mobility  Activity Ambulated with assistance in hallway  Level of Assistance Contact guard assist, steadying assist  Assistive Device None  Distance Ambulated (ft) 60 ft  Activity Response Tolerated well  $Mobility charge 1 Mobility   Pre-mobility: 94% SpO2 During mobility: 90% SpO2 Post-mobility: 93% SPO2  Pt received in bed agreeable to mobility. C/o SOB, encouraged pursed lip breathing throughout. Left EOB w/ call bell in reach and all needs met.   Paulla Dolly Mobility Specialist

## 2022-04-11 ENCOUNTER — Ambulatory Visit (INDEPENDENT_AMBULATORY_CARE_PROVIDER_SITE_OTHER): Payer: 59 | Admitting: Podiatry

## 2022-04-11 DIAGNOSIS — Z91199 Patient's noncompliance with other medical treatment and regimen due to unspecified reason: Secondary | ICD-10-CM

## 2022-04-11 NOTE — Progress Notes (Signed)
No show for appointment. In hospital. No charge.

## 2022-04-12 NOTE — Progress Notes (Signed)
LMOM to call back to reschedule

## 2022-06-07 ENCOUNTER — Telehealth: Payer: Self-pay

## 2022-06-07 NOTE — Telephone Encounter (Signed)
..     Pre-operative Risk Assessment    Patient Name: Angela [name]  DOB: 06-29-1961 MRN: [id number]      Request for Surgical Clearance    Procedure:   colonoscopy  Date of Surgery:  Clearance TBD                                 Surgeon: Kristeen Miss Surgeon's Group or Practice Name:   [facility] Phone number:  [phone] Fax number:  [phone]   Type of Clearance Requested:   - Medical  - Pharmacy:  Hold Apixaban (Eliquis)     Type of Anesthesia:   propofol   Additional requests/questions:    [name]   06/07/2022, 1:34 PM

## 2022-06-08 NOTE — Telephone Encounter (Signed)
Pt has been scheduled to see Diona Browner, NP, 06/14/22, clearance will be addressed at that time.  Will route to requesting surgeon's office to make them aware.

## 2022-06-08 NOTE — Telephone Encounter (Signed)
   Name: Angela [name]  DOB: Apr 19, 1961  MRN: [id number]  Primary Cardiologist: [name], MD  Chart reviewed as part of pre-operative protocol coverage. Because of Toyia R Cristina's past medical history and time since last visit, she will require a follow-up in-office visit in order to better assess preoperative cardiovascular risk.  Pre-op covering staff: - Please schedule appointment and call patient to inform them. If patient already had an upcoming appointment within acceptable timeframe, please add "pre-op clearance" to the appointment notes so provider is aware. - Please contact requesting surgeon's office via preferred method (i.e, phone, fax) to inform them of need for appointment prior to surgery.  This message will also be routed to pharmacy pool for input on holding Eliquis as requested below so that this information is available to the clearing provider at time of patient's appointment.   Lenna Sciara, NP  06/08/2022, 10:48 AM

## 2022-06-12 NOTE — Telephone Encounter (Signed)
    Primary Cardiologist:Mihai Croitoru, MD  Chart reviewed as part of pre-operative protocol coverage. Because of Angela [name]'s past medical history and time since last visit, he/she will require a follow-up visit in order to better assess preoperative cardiovascular risk.  Pre-op covering staff: - Please schedule appointment and call patient to inform them. - Please contact requesting surgeon's office via preferred method (i.e, phone, fax) to inform them of need for appointment prior to surgery.  If applicable, this message will also be routed to pharmacy pool and/or primary cardiologist for input on holding anticoagulant/antiplatelet agent as requested below so that this information is available at time of patient's appointment.   Deberah Pelton, NP  06/12/2022, 10:26 AM

## 2022-06-12 NOTE — Telephone Encounter (Signed)
Patient with diagnosis of afib on Eliquis for anticoagulation.    Procedure: colonoscopy Date of procedure: TBD  CHA2DS2-VASc Score = 4  This indicates a 4.8% annual risk of stroke. The patient's score is based upon: CHF History: 1 HTN History: 1 Diabetes History: 1 Stroke History: 0 Vascular Disease History: 0 Age Score: 0 Gender Score: 1   CrCl 54mL/min using adjusted body weight due to obesity Platelet count 214K  Per office protocol, patient can hold Eliquis for 2 days prior to procedure.    **This guidance is not considered finalized until pre-operative APP has relayed final recommendations.**

## 2022-06-13 NOTE — Telephone Encounter (Signed)
Pt has appt 06/14/22 with Diona Browner, NP

## 2022-06-14 ENCOUNTER — Encounter: Payer: Self-pay | Admitting: Nurse Practitioner

## 2022-06-14 ENCOUNTER — Ambulatory Visit: Payer: 59 | Attending: Nurse Practitioner | Admitting: Nurse Practitioner

## 2022-06-14 VITALS — BP 132/69 | HR 70 | Ht 62.0 in | Wt 284.6 lb

## 2022-06-14 DIAGNOSIS — N184 Chronic kidney disease, stage 4 (severe): Secondary | ICD-10-CM

## 2022-06-14 DIAGNOSIS — I10 Essential (primary) hypertension: Secondary | ICD-10-CM | POA: Diagnosis not present

## 2022-06-14 DIAGNOSIS — E782 Mixed hyperlipidemia: Secondary | ICD-10-CM | POA: Diagnosis not present

## 2022-06-14 DIAGNOSIS — E1165 Type 2 diabetes mellitus with hyperglycemia: Secondary | ICD-10-CM

## 2022-06-14 DIAGNOSIS — I480 Paroxysmal atrial fibrillation: Secondary | ICD-10-CM

## 2022-06-14 DIAGNOSIS — I5032 Chronic diastolic (congestive) heart failure: Secondary | ICD-10-CM | POA: Diagnosis not present

## 2022-06-14 DIAGNOSIS — G4733 Obstructive sleep apnea (adult) (pediatric): Secondary | ICD-10-CM

## 2022-06-14 DIAGNOSIS — Z01810 Encounter for preprocedural cardiovascular examination: Secondary | ICD-10-CM

## 2022-06-14 NOTE — Patient Instructions (Signed)
Medication Instructions:  Your physician recommends that you continue on your current medications as directed. Please refer to the Current Medication list given to you today.   *If you need a refill on your cardiac medications before your next appointment, please call your pharmacy*   Lab Work: NONE ordered at this time of appointment   If you have labs (blood work) drawn today and your tests are completely normal, you will receive your results only by: [place] (if you have MyChart) OR A paper copy in the mail If you have any lab test that is abnormal or we need to change your treatment, we will call you to review the results.   Testing/Procedures: NONE ordered at this time of appointment     Follow-Up: At [facility], you and your health needs are our priority.  As part of our continuing mission to provide you with exceptional heart care, we have created designated Provider Care Teams.  These Care Teams include your primary Cardiologist (physician) and Advanced Practice Providers (APPs -  Physician Assistants and Nurse Practitioners) who all work together to provide you with the care you need, when you need it.  We recommend signing up for the patient portal called "MyChart".  Sign up information is provided on this After Visit Summary.  MyChart is used to connect with patients for Virtual Visits (Telemedicine).  Patients are able to view lab/test results, encounter notes, upcoming appointments, etc.  Non-urgent messages can be sent to your provider as well.   To learn more about what you can do with MyChart, go to [email or web address].    Your next appointment:   1 year(s)  The format for your next appointment:   In Person  Provider:   [name], MD     Other Instructions Hold Eliquis 2 days prior to Colonoscopy and start back after procedure.   Important Information About Sugar

## 2022-06-14 NOTE — Progress Notes (Signed)
Office Visit    Patient Name: Angela [name] Date of Encounter: 06/14/2022  Primary Care Provider:  Cari Caraway, MD Primary Cardiologist:  [name], MD  Chief Complaint    61 year old female with a history of paroxysmal atrial fibrillation, chronic diastolic heart failure, hypertension, hyperlipidemia, CKD stage IIIa, type 2 diabetes, obesity, and OSA who presents for follow-up related to atrial fibrillation and for preoperative cardiac evaluation.  Past Medical History    Past Medical History:  Diagnosis Date   Atrial fibrillation with RVR ([place])    Complication of anesthesia    had epidural for C-section and epidural did not take until after surgery   Depression    Diabetes mellitus without complication (HCC)    GERD (gastroesophageal reflux disease)    Heart murmur    Hyperlipidemia    Hypertension    Obesity    Sleep apnea    Temporal arteritis ([place])    Past Surgical History:  Procedure Laterality Date   BREAST SURGERY     bilateral breast reduction   CARDIAC CATHETERIZATION N/A 02/09/2016   Procedure: Left Heart Cath and Coronary Angiography;  Surgeon: Sherren Mocha, MD;  Location: [facility];  Service: Cardiovascular;  Laterality: N/A;   CESAREAN SECTION     COLONOSCOPY     TUBAL LIGATION      Allergies  Allergies  Allergen Reactions   E-Mycin [Erythromycin] Nausea And Vomiting   Avocado Diarrhea and Nausea And Vomiting   Banana Diarrhea and Nausea And Vomiting   Glucophage [Metformin Hcl] Diarrhea and Nausea And Vomiting   Kiwi Extract Diarrhea and Nausea And Vomiting   Mango Flavor Diarrhea   Metoprolol     Unclear, patient was also started on Xarelto, developed severe itching and LE weakness, stopped both and symptoms resolved   Xarelto [Rivaroxaban]     Unclear, patient was also started on Metoprolol, developed severe itching/LE weakness, stopped both and her and symptoms resolved   Diltiazem Hcl Rash   Levemir [Insulin Detemir]  Rash   Lexapro [Escitalopram Oxalate] Itching and Other (See Comments)    Numb lips    History of Present Illness    61 year old female with the above past medical history including paroxysmal atrial fibrillation, chronic diastolic heart failure, hypertension, hyperlipidemia, CKD stage IIIa, type 2 diabetes, obesity, and OSA.  She has followed in the A-fib clinic.  She has been maintained on metoprolol and Xarelto, however, she reported an adverse reaction to these medications and was later transitioned to Eliquis and carvedilol.  She has tolerated this combination well.  She was last seen in the office on 06/23/2021 and was stable from a cardiac standpoint.  She did note increased episodes of atrial fibrillation following COVID-19 infection, however, at her visit she reported a return to baseline.  She does have a history of untreated sleep apnea.  She was hospitalized in September 2023 in the setting of symptomatic anemia s/p transfusion.  Eliquis was continued.  She was advised to follow-up with cardiology/PCP for ongoing management of untreated OSA.  She presents today for follow-up and for preoperative cardiac evaluation for upcoming colonoscopy with [facility].  Since her last visit she has been stable from a cardiac standpoint.  She does note that she had a recurrence of COVID-19 infection after her last visit with our office in December 2022.  She has had a slow recovery from this.  She notes stable chronic bilateral lower extremity edema.  She denies chest pain, dyspnea, PND,  orthopnea, weight gain.    Home Medications    Current Outpatient Medications  Medication Sig Dispense Refill   amLODipine (NORVASC) 10 MG tablet Take 10 mg by mouth daily.     apixaban (ELIQUIS) 5 MG TABS tablet Take 1 tablet (5 mg total) by mouth 2 (two) times daily. 60 tablet 5   carvedilol (COREG CR) 10 MG 24 hr capsule Take 10 mg by mouth daily.     chlorthalidone (HYGROTON) 25 MG tablet Take 12.5 mg by mouth  daily.     dorzolamide-timolol (COSOPT) 22.3-6.8 MG/ML ophthalmic solution Place 1 drop into both eyes at bedtime.     FARXIGA 10 MG TABS tablet Take 10 mg by mouth daily.     glimepiride (AMARYL) 4 MG tablet Take 1 tablet by mouth daily with breakfast.      Insulin Glargine (BASAGLAR KWIKPEN) 100 UNIT/ML Inject 50 Units into the skin 2 (two) times daily.     rosuvastatin (CRESTOR) 20 MG tablet Take 20 mg by mouth daily.     Vitamin D, Ergocalciferol, (DRISDOL) 1.25 MG (50000 UNIT) CAPS capsule Take 50,000 Units by mouth once a week.     No current facility-administered medications for this visit.     Review of Systems    She denies chest pain, palpitations, dyspnea, pnd, orthopnea, n, v, dizziness, syncope, weight gain, or early satiety. All other systems reviewed and are otherwise negative except as noted above.   Physical Exam    VS:  BP 132/69   Pulse 70   Ht 5\' 2"  (1.575 m)   Wt 284 lb 9.6 oz (129.1 kg)   SpO2 98%   BMI 52.05 kg/m  GEN: Well nourished, well developed, in no acute distress. HEENT: normal. Neck: Supple, no JVD, carotid bruits, or masses. Cardiac: RRR, no murmurs, rubs, or gallops. No clubbing, cyanosis, nonpitting bilateral lower extremity edema.  Radials/DP/PT 2+ and equal bilaterally.  Respiratory:  Respirations regular and unlabored, clear to auscultation bilaterally. GI: Obese, soft, nontender, nondistended, BS + x 4. MS: no deformity or atrophy. Skin: warm and dry, no rash. Neuro:  Strength and sensation are intact. Psych: Normal affect.  Accessory Clinical Findings    ECG personally reviewed by me today -NSR, 70 bpm- no acute changes.   Lab Results  Component Value Date   WBC 9.1 04/07/2022   HGB 8.5 (L) 04/07/2022   HCT 26.9 (L) 04/07/2022   MCV 87.9 04/07/2022   PLT 214 04/07/2022   Lab Results  Component Value Date   CREATININE 2.63 (H) 04/07/2022   BUN 36 (H) 04/07/2022   NA 144 04/07/2022   K 4.0 04/07/2022   CL 109 04/07/2022   CO2  23 04/07/2022   Lab Results  Component Value Date   ALT 12 04/06/2022   AST 14 (L) 04/06/2022   ALKPHOS 55 04/06/2022   BILITOT 0.7 04/06/2022   Lab Results  Component Value Date   CHOL 172 02/09/2016   HDL 72 02/09/2016   LDLCALC 81 02/09/2016   TRIG 94 02/09/2016   CHOLHDL 2.4 02/09/2016    Lab Results  Component Value Date   HGBA1C 6.7 (H) 04/07/2022    Assessment & Plan    1. Paroxysmal atrial fibrillation: Maintaining NSR. Denies any palpitations. Denies bleeding.  Continue carvedilol, Eliquis.  2. Chronic diastolic heart failure: Stable chronic bilateral  lower extremity edema, othherwise, euvolemic and well compensated on exam.  Continue carvedilol, chlorthalidone, Farxiga.   3. Hypertension: BP well controlled. Continue current antihypertensive  regimen.   4. Hyperlipidemia: LDL was 49 in 11/2021.  Continue Crestor.  5. CKD stage III-4: Creatinine was 2.47 in 03/2022.  Follows with nephrology.   6. Type 2 diabetes: A1c was 6.7 in 03/2022.  Monitored and managed per PCP.  7. Obesity: Continue to encourage lifestyle modifications with weight loss and exercise.  8. OSA: Declines sleep study. Intolerant to CPAP.   9. Preoperative cardiac exam: Patient is higher risk for procedures based on comorbidities. According to the Revised Cardiac Risk Index (RCRI), her Perioperative Risk of Major Cardiac Event is (%): 11. Her Functional Capacity in METs is: 5.04 according to the Duke Activity Status Index (DASI). Therefore, based on ACC/AHA guidelines, patient would be at acceptable risk for the planned procedure without further cardiovascular testing. Per office protocol, patient can hold Eliquis for 2 days prior to procedure. Please resume Eliquis as soon as possible postprocedure, at the discretion of the surgeon.   I will route this recommendation to the requesting party via Epic fax function.  10. Disposition: Follow-up in 1 year.      Lenna Sciara, NP 06/14/2022, 5:15 PM

## 2022-06-23 ENCOUNTER — Other Ambulatory Visit: Payer: Self-pay | Admitting: Gastroenterology

## 2022-06-27 ENCOUNTER — Encounter (HOSPITAL_COMMUNITY): Payer: Self-pay | Admitting: Gastroenterology

## 2022-06-27 NOTE — Progress Notes (Signed)
Attempted to obtain medical history via telephone, unable to reach at this time. HIPAA compliant voicemail message left requesting return call to pre surgical testing department. 

## 2022-06-28 ENCOUNTER — Encounter (HOSPITAL_COMMUNITY): Payer: Self-pay | Admitting: Gastroenterology

## 2022-07-03 NOTE — Anesthesia Preprocedure Evaluation (Signed)
Anesthesia Evaluation  Patient identified by MRN, date of birth, ID band Patient awake    Reviewed: Allergy & Precautions, NPO status , Patient's Chart, lab work & pertinent test results  Airway Mallampati: II  TM Distance: >3 FB Neck ROM: Full    Dental no notable dental hx. (+) Teeth Intact, Dental Advisory Given   Pulmonary sleep apnea and Continuous Positive Airway Pressure Ventilation    Pulmonary exam normal breath sounds clear to auscultation       Cardiovascular hypertension, Pt. on medications and Pt. on home beta blockers Normal cardiovascular exam+ dysrhythmias Atrial Fibrillation  Rhythm:Regular Rate:Normal     Neuro/Psych    GI/Hepatic ,GERD  ,,  Endo/Other  diabetes, Type 2  Morbid obesity (BMI 52)  Renal/GU Renal InsufficiencyRenal disease     Musculoskeletal   Abdominal   Peds  Hematology   Anesthesia Other Findings All: Metformin metoprolol, xarelto , emycin, diltiazem, levimir, lexapro  Reproductive/Obstetrics                              Anesthesia Physical Anesthesia Plan  ASA: 3  Anesthesia Plan: MAC   Post-op Pain Management:    Induction: Intravenous  PONV Risk Score and Plan: Treatment may vary due to age or medical condition, TIVA and Propofol infusion  Airway Management Planned: Nasal Cannula and Natural Airway  Additional Equipment: None  Intra-op Plan:   Post-operative Plan:   Informed Consent: I have reviewed the patients History and Physical, chart, labs and discussed the procedure including the risks, benefits and alternatives for the proposed anesthesia with the patient or authorized representative who has indicated his/her understanding and acceptance.     Dental advisory given  Plan Discussed with: CRNA, Anesthesiologist and Surgeon  Anesthesia Plan Comments: (Family Hx of colon CA for Colonoscopy)         Anesthesia Quick  Evaluation

## 2022-07-04 ENCOUNTER — Ambulatory Visit (HOSPITAL_COMMUNITY)
Admission: RE | Admit: 2022-07-04 | Discharge: 2022-07-04 | Disposition: A | Discharge status: Home / Self Care | Payer: 59 | Source: Ambulatory Visit | Attending: Gastroenterology | Admitting: Gastroenterology

## 2022-07-04 ENCOUNTER — Ambulatory Visit (HOSPITAL_COMMUNITY): Payer: 59 | Admitting: Anesthesiology

## 2022-07-04 ENCOUNTER — Ambulatory Visit (HOSPITAL_BASED_OUTPATIENT_CLINIC_OR_DEPARTMENT_OTHER): Payer: 59 | Admitting: Anesthesiology

## 2022-07-04 ENCOUNTER — Encounter (HOSPITAL_COMMUNITY)
Admission: RE | Disposition: A | Discharge status: Home / Self Care | Payer: Self-pay | Source: Ambulatory Visit | Attending: Gastroenterology

## 2022-07-04 ENCOUNTER — Encounter (HOSPITAL_COMMUNITY): Payer: Self-pay | Admitting: Gastroenterology

## 2022-07-04 ENCOUNTER — Other Ambulatory Visit: Payer: Self-pay

## 2022-07-04 DIAGNOSIS — E119 Type 2 diabetes mellitus without complications: Secondary | ICD-10-CM | POA: Insufficient documentation

## 2022-07-04 DIAGNOSIS — K640 First degree hemorrhoids: Secondary | ICD-10-CM

## 2022-07-04 DIAGNOSIS — G4733 Obstructive sleep apnea (adult) (pediatric): Secondary | ICD-10-CM

## 2022-07-04 DIAGNOSIS — Z800 Family history of malignant neoplasm of digestive organs: Secondary | ICD-10-CM

## 2022-07-04 DIAGNOSIS — Z6841 Body Mass Index (BMI) 40.0 and over, adult: Secondary | ICD-10-CM | POA: Diagnosis not present

## 2022-07-04 DIAGNOSIS — Z9989 Dependence on other enabling machines and devices: Secondary | ICD-10-CM

## 2022-07-04 DIAGNOSIS — K648 Other hemorrhoids: Secondary | ICD-10-CM | POA: Insufficient documentation

## 2022-07-04 DIAGNOSIS — Z1211 Encounter for screening for malignant neoplasm of colon: Secondary | ICD-10-CM | POA: Diagnosis present

## 2022-07-04 DIAGNOSIS — Z7901 Long term (current) use of anticoagulants: Secondary | ICD-10-CM | POA: Diagnosis not present

## 2022-07-04 DIAGNOSIS — I10 Essential (primary) hypertension: Secondary | ICD-10-CM | POA: Diagnosis not present

## 2022-07-04 DIAGNOSIS — G4730 Sleep apnea, unspecified: Secondary | ICD-10-CM | POA: Diagnosis not present

## 2022-07-04 HISTORY — PX: COLONOSCOPY WITH PROPOFOL: SHX5780

## 2022-07-04 LAB — GLUCOSE, CAPILLARY: Glucose-Capillary: 149 mg/dL — ABNORMAL HIGH (ref 70–99)

## 2022-07-04 SURGERY — COLONOSCOPY WITH PROPOFOL
Anesthesia: Monitor Anesthesia Care

## 2022-07-04 MED ORDER — PROPOFOL 1000 MG/100ML IV EMUL
INTRAVENOUS | Status: AC
  Filled 2022-07-04: qty 100

## 2022-07-04 MED ORDER — PROPOFOL 10 MG/ML IV BOLUS
INTRAVENOUS | Status: DC | PRN
  Administered 2022-07-04: 20 mg via INTRAVENOUS

## 2022-07-04 MED ORDER — PROPOFOL 500 MG/50ML IV EMUL
INTRAVENOUS | Status: DC | PRN
  Administered 2022-07-04: 100 ug/kg/min via INTRAVENOUS

## 2022-07-04 MED ORDER — LACTATED RINGERS IV SOLN
INTRAVENOUS | Status: AC | PRN
  Administered 2022-07-04: 1000 mL via INTRAVENOUS

## 2022-07-04 MED ORDER — SODIUM CHLORIDE 0.9 % IV SOLN: INTRAVENOUS | Status: DC

## 2022-07-04 SURGICAL SUPPLY — 22 items

## 2022-07-04 NOTE — Op Note (Addendum)
[facility] Patient Name: Angela [name] Procedure Date: 07/04/2022 MRN: [id number] Attending MD: [name] , MD, [id number] Date of Birth: September 26, 1960 CSN: [id number] Age: 61 Admit Type: Outpatient Procedure:                Colonoscopy Indications:              Screening in patient at increased risk: Colorectal                            cancer in mother 36 or older, Last colonoscopy:                            November 2013 Providers:                [name], MD, Adah Perl RN, RN,                            Cletis Athens, Technician Referring MD:             Leitha Bleak, MD Medicines:                Propofol per Anesthesia, Monitored Anesthesia Care Complications:            No immediate complications. Estimated Blood Loss:     Estimated blood loss: none. Procedure:                Pre-Anesthesia Assessment:                           - Prior to the procedure, a History and Physical                            was performed, and patient medications and                            allergies were reviewed. The patient's tolerance of                            previous anesthesia was also reviewed. The risks                            and benefits of the procedure and the sedation                            options and risks were discussed with the patient.                            All questions were answered, and informed consent                            was obtained. Prior Anticoagulants: The patient has                            taken Eliquis (apixaban), last dose was 2 days  prior to procedure. ASA Grade Assessment: III - A                            patient with severe systemic disease. After                            reviewing the risks and benefits, the patient was                            deemed in satisfactory condition to undergo the                            procedure.                            After obtaining informed consent, the colonoscope                            was passed under direct vision. Throughout the                            procedure, the patient's blood pressure, pulse, and                            oxygen saturations were monitored continuously. The                            [id number]-HQ190L ([id number]) Olympus colonoscope was                            introduced through the anus and advanced to the the                            cecum, identified by appendiceal orifice and                            ileocecal valve. The colonoscopy was performed with                            difficulty due to significant looping, a tortuous                            colon and fair prep. Successful completion of the                            procedure was aided by straightening and shortening                            the scope to obtain bowel loop reduction, using                            scope torsion, applying abdominal pressure and  lavage. The patient tolerated the procedure fairly                            well. The quality of the bowel preparation was fair                            and fair but repeated irrigation led to a good and                            adequate prep. The ileocecal valve, appendiceal                            orifice, and rectum were photographed. Scope In: 10:51:01 AM Scope Out: 11:04:23 AM Scope Withdrawal Time: 0 hours 7 minutes 51 seconds  Total Procedure Duration: 0 hours 13 minutes 22 seconds  Findings:      Hemorrhoids were found on perianal exam.      Internal hemorrhoids were found during retroflexion. The hemorrhoids       were small and Grade I (internal hemorrhoids that do not prolapse).      A large amount of liquid semi-liquid stool was found in the entire       colon, interfering with visualization. Lavage of the area was performed,       resulting in clearance with good visualization. Impression:                - Preparation of the colon was fair.                           - Hemorrhoids found on perianal exam.                           - Internal hemorrhoids.                           - Stool in the entire examined colon.                           - No specimens collected. Moderate Sedation:      N/A - MAC procedure Recommendation:           - Patient has a contact number available for                            emergencies. The signs and symptoms of potential                            delayed complications were discussed with the                            patient. Return to normal activities tomorrow.                            Written discharge instructions were provided to the                            patient.                           -  High fiber diet.                           - Repeat colonoscopy in 5 years for screening                            purposes. 2 Day prep.                           - Resume Eliquis (apixaban) at prior dose today. Procedure Code(s):        --- Professional ---                           [phone], Colonoscopy, flexible; diagnostic, including                            collection of specimen(s) by brushing or washing,                            when performed (separate procedure) Diagnosis Code(s):        --- Professional ---                           Z80.0, Family history of malignant neoplasm of                            digestive organs                           K64.0, First degree hemorrhoids CPT copyright 2022 American Medical Association. All rights reserved. The codes documented in this report are preliminary and upon coder review may  be revised to meet current compliance requirements. [name], MD 07/04/2022 11:10:24 AM This report has been signed electronically. Number of Addenda: 0

## 2022-07-04 NOTE — Anesthesia Postprocedure Evaluation (Signed)
Anesthesia Post Note  Patient: Angela [name]  Procedure(s) Performed: COLONOSCOPY WITH PROPOFOL     Patient location during evaluation: Endoscopy Anesthesia Type: MAC Level of consciousness: awake and alert Pain management: pain level controlled Vital Signs Assessment: post-procedure vital signs reviewed and stable Respiratory status: spontaneous breathing, nonlabored ventilation, respiratory function stable and patient connected to nasal cannula oxygen Cardiovascular status: blood pressure returned to baseline and stable Postop Assessment: no apparent nausea or vomiting Anesthetic complications: no  No notable events documented.  Last Vitals:  Vitals:   07/04/22 1120 07/04/22 1130  BP: (!) 155/72 (!) 162/63  Pulse: 73 73  Resp: 18 19  Temp:    SpO2: 99% 100%    Last Pain:  Vitals:   07/04/22 1130  TempSrc:   PainSc: 0-No pain                 Barnet Glasgow

## 2022-07-04 NOTE — Discharge Instructions (Addendum)
Resume Eliquis today.YOU HAD AN ENDOSCOPIC PROCEDURE TODAY: Refer to the procedure report and other information in the discharge instructions given to you for any specific questions about what was found during the examination. If this information does not answer your questions, please call [facility] office at [phone] to clarify.   YOU SHOULD EXPECT: Some feelings of bloating in the abdomen. Passage of more gas than usual. Walking can help get rid of the air that was put into your GI tract during the procedure and reduce the bloating. If you had a lower endoscopy (such as a colonoscopy or flexible sigmoidoscopy) you may notice spotting of blood in your stool or on the toilet paper. Some abdominal soreness may be present for a day or two, also.  DIET: Your first meal following the procedure should be a light meal and then it is ok to progress to your normal diet. A half-sandwich or bowl of soup is an example of a good first meal. Heavy or fried foods are harder to digest and may make you feel nauseous or bloated. Drink plenty of fluids but you should avoid alcoholic beverages for 24 hours. If you had a esophageal dilation, please see attached instructions for diet.    ACTIVITY: Your care partner should take you home directly after the procedure. You should plan to take it easy, moving slowly for the rest of the day. You can resume normal activity the day after the procedure however YOU SHOULD NOT DRIVE, use power tools, machinery or perform tasks that involve climbing or major physical exertion for 24 hours (because of the sedation medicines used during the test).   SYMPTOMS TO REPORT IMMEDIATELY: A gastroenterologist can be reached at any hour. Please call [phone]  for any of the following symptoms:  Following lower endoscopy (colonoscopy, flexible sigmoidoscopy) Excessive amounts of blood in the stool  Significant tenderness, worsening of abdominal pains  Swelling of the abdomen that is new,  acute  Fever of 100 or higher  Following upper endoscopy (EGD, EUS, ERCP, esophageal dilation) Vomiting of blood or coffee ground material  New, significant abdominal pain  New, significant chest pain or pain under the shoulder blades  Painful or persistently difficult swallowing  New shortness of breath  Black, tarry-looking or red, bloody stools  FOLLOW UP:  If any biopsies were taken you will be contacted by phone or by letter within the next 1-3 weeks. Call [phone]  if you have not heard about the biopsies in 3 weeks.  Please also call with any specific questions about appointments or follow up tests. YOU HAD AN ENDOSCOPIC PROCEDURE TODAY: Refer to the procedure report and other information in the discharge instructions given to you for any specific questions about what was found during the examination. If this information does not answer your questions, please call [facility] office at [phone] to clarify.   YOU SHOULD EXPECT: Some feelings of bloating in the abdomen. Passage of more gas than usual. Walking can help get rid of the air that was put into your GI tract during the procedure and reduce the bloating. If you had a lower endoscopy (such as a colonoscopy or flexible sigmoidoscopy) you may notice spotting of blood in your stool or on the toilet paper. Some abdominal soreness may be present for a day or two, also.  DIET: Your first meal following the procedure should be a light meal and then it is ok to progress to your normal diet. A half-sandwich or bowl of soup  is an example of a good first meal. Heavy or fried foods are harder to digest and may make you feel nauseous or bloated. Drink plenty of fluids but you should avoid alcoholic beverages for 24 hours. If you had a esophageal dilation, please see attached instructions for diet.    ACTIVITY: Your care partner should take you home directly after the procedure. You should plan to take it easy, moving slowly for the rest of  the day. You can resume normal activity the day after the procedure however YOU SHOULD NOT DRIVE, use power tools, machinery or perform tasks that involve climbing or major physical exertion for 24 hours (because of the sedation medicines used during the test).   SYMPTOMS TO REPORT IMMEDIATELY: A gastroenterologist can be reached at any hour. Please call [phone]  for any of the following symptoms:  Following lower endoscopy (colonoscopy, flexible sigmoidoscopy) Excessive amounts of blood in the stool  Significant tenderness, worsening of abdominal pains  Swelling of the abdomen that is new, acute  Fever of 100 or higher  Following upper endoscopy (EGD, EUS, ERCP, esophageal dilation) Vomiting of blood or coffee ground material  New, significant abdominal pain  New, significant chest pain or pain under the shoulder blades  Painful or persistently difficult swallowing  New shortness of breath  Black, tarry-looking or red, bloody stools  FOLLOW UP:  If any biopsies were taken you will be contacted by phone or by letter within the next 1-3 weeks. Call [phone]  if you have not heard about the biopsies in 3 weeks.  Please also call with any specific questions about appointments or follow up tests.

## 2022-07-04 NOTE — H&P (Signed)
Date of Initial H&P: 06/23/22  History reviewed, patient examined, no change in status, stable for surgery.

## 2022-07-04 NOTE — Anesthesia Procedure Notes (Signed)
Procedure Name: MAC Date/Time: 07/04/2022 10:47 AM  Performed by: Jenne Campus, CRNAPre-anesthesia Checklist: Patient identified, Emergency Drugs available, Suction available and Patient being monitored Oxygen Delivery Method: Simple face mask Placement Confirmation: positive ETCO2

## 2022-07-04 NOTE — Interval H&P Note (Signed)
History and Physical Interval Note:  07/04/2022 10:25 AM  Angela [name]  has presented today for surgery, with the diagnosis of Screening and family h/o colon cancer.  The various methods of treatment have been discussed with the patient and family. After consideration of risks, benefits and other options for treatment, the patient has consented to  Procedure(s): COLONOSCOPY WITH PROPOFOL (N/A) as a surgical intervention.  The patient's history has been reviewed, patient examined, no change in status, stable for surgery.  I have reviewed the patient's chart and labs.  Questions were answered to the patient's satisfaction.     [name]

## 2022-07-04 NOTE — Transfer of Care (Signed)
Immediate Anesthesia Transfer of Care Note  Patient: Angela [name]  Procedure(s) Performed: COLONOSCOPY WITH PROPOFOL  Patient Location: Endoscopy Unit  Anesthesia Type:MAC  Level of Consciousness: oriented, drowsy, and patient cooperative  Airway & Oxygen Therapy: Patient Spontanous Breathing and Patient connected to face mask oxygen  Post-op Assessment: Report given to RN and Post -op Vital signs reviewed and stable  Post vital signs: Reviewed  Last Vitals:  Vitals Value Taken Time  BP    Temp    Pulse 76 07/04/22 1113  Resp 33 07/04/22 1113  SpO2 100 % 07/04/22 1113  Vitals shown include unvalidated device data.  Last Pain:  Vitals:   07/04/22 0916  TempSrc: Tympanic  PainSc: 0-No pain         Complications: No notable events documented.

## 2022-07-09 ENCOUNTER — Encounter (HOSPITAL_COMMUNITY): Payer: Self-pay | Admitting: Gastroenterology

## 2022-08-17 ENCOUNTER — Encounter: Payer: Self-pay | Admitting: Family Medicine

## 2022-08-17 DIAGNOSIS — Z1231 Encounter for screening mammogram for malignant neoplasm of breast: Secondary | ICD-10-CM

## 2022-08-18 ENCOUNTER — Other Ambulatory Visit: Payer: Self-pay | Admitting: Family Medicine

## 2022-08-18 DIAGNOSIS — Z1231 Encounter for screening mammogram for malignant neoplasm of breast: Secondary | ICD-10-CM

## 2022-08-21 ENCOUNTER — Other Ambulatory Visit: Payer: Self-pay | Admitting: Family Medicine

## 2022-08-21 DIAGNOSIS — N184 Chronic kidney disease, stage 4 (severe): Secondary | ICD-10-CM

## 2022-08-31 ENCOUNTER — Ambulatory Visit
Admission: RE | Admit: 2022-08-31 | Discharge: 2022-08-31 | Disposition: A | Discharge status: Home / Self Care | Payer: 59 | Source: Ambulatory Visit | Attending: Family Medicine | Admitting: Family Medicine

## 2022-08-31 ENCOUNTER — Encounter (HOSPITAL_COMMUNITY): Payer: Self-pay | Admitting: *Deleted

## 2022-08-31 DIAGNOSIS — N184 Chronic kidney disease, stage 4 (severe): Secondary | ICD-10-CM

## 2022-09-28 ENCOUNTER — Telehealth: Payer: Self-pay

## 2022-09-28 NOTE — Telephone Encounter (Signed)
Can you please check if we received Iron infusion orders from [facility] physicians. Thank you.

## 2022-10-02 ENCOUNTER — Other Ambulatory Visit: Payer: Self-pay | Admitting: Internal Medicine

## 2022-10-02 DIAGNOSIS — N2889 Other specified disorders of kidney and ureter: Secondary | ICD-10-CM

## 2022-10-25 ENCOUNTER — Ambulatory Visit
Admission: RE | Admit: 2022-10-25 | Discharge: 2022-10-25 | Disposition: A | Discharge status: Home / Self Care | Payer: 59 | Source: Ambulatory Visit | Attending: Family Medicine | Admitting: Family Medicine

## 2022-10-25 DIAGNOSIS — Z1231 Encounter for screening mammogram for malignant neoplasm of breast: Secondary | ICD-10-CM

## 2022-10-26 ENCOUNTER — Ambulatory Visit
Admission: RE | Admit: 2022-10-26 | Discharge: 2022-10-26 | Disposition: A | Discharge status: Home / Self Care | Payer: 59 | Source: Ambulatory Visit | Attending: Internal Medicine | Admitting: Internal Medicine

## 2022-10-26 DIAGNOSIS — N2889 Other specified disorders of kidney and ureter: Secondary | ICD-10-CM

## 2022-10-26 MED ORDER — GADOPICLENOL 0.5 MMOL/ML IV SOLN
10.0000 mL | Freq: Once | INTRAVENOUS | Status: AC | PRN
  Administered 2022-10-26: 10 mL via INTRAVENOUS

## 2022-10-27 ENCOUNTER — Other Ambulatory Visit: Payer: Self-pay | Admitting: Family Medicine

## 2022-10-27 DIAGNOSIS — R928 Other abnormal and inconclusive findings on diagnostic imaging of breast: Secondary | ICD-10-CM

## 2022-11-07 ENCOUNTER — Ambulatory Visit
Admission: RE | Admit: 2022-11-07 | Discharge: 2022-11-07 | Disposition: A | Discharge status: Home / Self Care | Payer: 59 | Source: Ambulatory Visit | Attending: Family Medicine | Admitting: Family Medicine

## 2022-11-07 DIAGNOSIS — R928 Other abnormal and inconclusive findings on diagnostic imaging of breast: Secondary | ICD-10-CM

## 2022-11-15 ENCOUNTER — Other Ambulatory Visit: Payer: Self-pay | Admitting: Family Medicine

## 2022-11-15 DIAGNOSIS — R921 Mammographic calcification found on diagnostic imaging of breast: Secondary | ICD-10-CM

## 2023-03-27 ENCOUNTER — Telehealth: Payer: Self-pay | Admitting: Cardiovascular Disease

## 2023-03-27 MED ORDER — DILTIAZEM HCL 30 MG PO TABS: ORAL_TABLET | ORAL | 0 refills | Status: AC

## 2023-03-27 NOTE — Telephone Encounter (Signed)
Patient is wanting a refill on her Cardizem 30mg  as needed for afib. The medication is not on her med list and she got rid of "the old medicine". She has an appt in December. Please advise.

## 2023-03-27 NOTE — Telephone Encounter (Signed)
Called pt to let her know of Dr. Royann Shivers advice below.   Please send in Rx for diltiazem 30 mg up to 4 times daily as needed for HR>100 bpm, #30, no RF

## 2023-03-27 NOTE — Telephone Encounter (Signed)
Patient returned staff call and stated prescription should be sent to [id number] - [place], [place] - [address]  Patient stated she does not take this medication very often and wants to get a 90 day supply.

## 2023-03-27 NOTE — Telephone Encounter (Signed)
LM to call office.  Need to verify pharmacy and send RX to last until December F/U

## 2023-03-27 NOTE — Telephone Encounter (Signed)
Please send in Rx for diltiazem 30 mg up to 4 times daily as needed for HR>100 bpm, #30, no RF

## 2023-03-27 NOTE — Telephone Encounter (Signed)
Pt c/o medication issue:  1. Name of Medication:   diltiazem (CARDIZEM) 30 MG tablet   2. How are you currently taking this medication (dosage and times per day)?   3. Are you having a reaction (difficulty breathing--STAT)?   4. What is your medication issue?   Patient stated she had still been taking this medication as needed for afib.  Patient stated the medication she has is old and wants to get new medication.

## 2023-05-10 ENCOUNTER — Ambulatory Visit
Admission: RE | Admit: 2023-05-10 | Discharge: 2023-05-10 | Disposition: A | Discharge status: Home / Self Care | Payer: 59 | Source: Ambulatory Visit | Attending: Family Medicine | Admitting: Family Medicine

## 2023-05-10 DIAGNOSIS — R921 Mammographic calcification found on diagnostic imaging of breast: Secondary | ICD-10-CM

## 2023-05-11 ENCOUNTER — Other Ambulatory Visit: Payer: Self-pay | Admitting: Internal Medicine

## 2023-05-11 DIAGNOSIS — R921 Mammographic calcification found on diagnostic imaging of breast: Secondary | ICD-10-CM

## 2023-05-18 ENCOUNTER — Other Ambulatory Visit (HOSPITAL_COMMUNITY): Payer: Self-pay | Admitting: *Deleted

## 2023-05-21 ENCOUNTER — Ambulatory Visit (HOSPITAL_COMMUNITY)
Admission: RE | Admit: 2023-05-21 | Discharge: 2023-05-21 | Disposition: A | Discharge status: Home / Self Care | Payer: 59 | Source: Ambulatory Visit | Attending: Internal Medicine | Admitting: Internal Medicine

## 2023-05-21 DIAGNOSIS — N189 Chronic kidney disease, unspecified: Secondary | ICD-10-CM | POA: Diagnosis present

## 2023-05-21 DIAGNOSIS — D631 Anemia in chronic kidney disease: Secondary | ICD-10-CM | POA: Insufficient documentation

## 2023-05-21 MED ORDER — SODIUM CHLORIDE 0.9 % IV SOLN
510.0000 mg | INTRAVENOUS | Status: DC
  Administered 2023-05-21: 510 mg via INTRAVENOUS
  Filled 2023-05-21: qty 17

## 2023-05-28 ENCOUNTER — Encounter (HOSPITAL_COMMUNITY)
Admission: RE | Admit: 2023-05-28 | Discharge: 2023-05-28 | Disposition: A | Discharge status: Home / Self Care | Payer: 59 | Source: Ambulatory Visit | Attending: Internal Medicine | Admitting: Internal Medicine

## 2023-05-28 DIAGNOSIS — N189 Chronic kidney disease, unspecified: Secondary | ICD-10-CM | POA: Insufficient documentation

## 2023-05-28 DIAGNOSIS — D631 Anemia in chronic kidney disease: Secondary | ICD-10-CM | POA: Insufficient documentation

## 2023-05-28 MED ORDER — SODIUM CHLORIDE 0.9 % IV SOLN
510.0000 mg | INTRAVENOUS | Status: DC
  Administered 2023-05-28: 510 mg via INTRAVENOUS
  Filled 2023-05-28: qty 510

## 2023-07-14 NOTE — Progress Notes (Unsigned)
Cardiology Office Note:    Date:  07/16/2023   ID:  ERICE BAHN, DOB 12-14-1960, MRN [id number]  PCP:  Gweneth Dimitri, MD   [facility] Providers Cardiologist:  Thurmon Fair, MD     Referring MD: Gweneth Dimitri, MD   Chief Complaint  Patient presents with   Fatigue      History of Present Illness:    Angela [name] is a 62 y.o. female with a hx of super-obesity, DM type 2 complicated by retinopathy with macular edema bilaterally and CKD stage IV, OSA , HTN, paroxysmal atrial fibrillation,  chronic diastolic heart failure.  She has a history of major depression. Hospitalized with symptomatic anemia requiring transfusion in September 2023, colonoscopy only showed grade 1 hemmorhoids.  Today her previous problem is fatigue, although this has improved compared to the summer when she was more tired and she also had frequent episodes of chest discomfort.  At that time her hemoglobin was as low as 7.4.  Simply sitting up at the kitchen counter peeling potatoes will make her have chest discomfort.  She will stop and rest and this would resolve.  She has not had any chest pain recently.  Her chest discomfort is described as a toothache and that radiates to her left arm.  She received an iron infusion and when her anemia improved, the chest discomfort also improved.  She has chronic brawny edema of both lower extremities, but does not have any active venous stasis ulcers.  She denies orthopnea or PND, but she usually sleeps in a recliner due to GERD.  She has not recently been troubled by palpitations. She had a few palpitations over the summer, but these have also improved.  She has not taken any diltiazem in months.  Continues to have NYHA functional class 2-3 status.   Her most recent metabolic profile is pretty good with a hemoglobin A1c that is 6.0%, HDL 53, LDL 57, normal triglycerides.  Her primary provider is planning to reduce her dose of glimepiride.  The most recent hemoglobin  on 06/11/2023 was up to 9.8.  She reports an adverse reaction when she was prescribed both Xarelto and metoprolol when she developed severe itching and leg weakness, resolved after she stopped both medications.  The exact culprit is unclear.  She is doing well on Eliquis and carvedilol.  Glycemic control is poor with a recent hemoglobin A1c of 9.9%.  She has excellent lipid parameters on rosuvastatin, with a recent LDL of 50 and HDL of 63.  She has abnormal renal function with a recent creatinine of 1.76.    She has a history of obstructive sleep apnea and underwent a sleep study and BiPAP titration in 2018, but is not using CPAP at this time.  She does have the equipment.  She had a cardiac catheterization in 2017 when she presented with atrial fibrillation with RVR and abnormal cardiac enzymes, had normal coronary arteries angiographically and evidence of elevated left ventricular end-diastolic pressure.  Her echo in 2017 suggests the presence of LVH, mild left atrial dilation and diastolic dysfunction.  The echo in 2017 as well as a follow-up study in 2019 shows normal left and right ventricular systolic function.  Past Medical History:  Diagnosis Date   Atrial fibrillation with RVR (HCC)    Complication of anesthesia    had epidural for C-section and epidural did not take until after surgery   Depression    Diabetes mellitus without complication (HCC)    GERD (gastroesophageal  reflux disease)    Heart murmur    Hyperlipidemia    Hypertension    Obesity    Sleep apnea    Temporal arteritis (HCC)     Past Surgical History:  Procedure Laterality Date   BREAST SURGERY     bilateral breast reduction   CARDIAC CATHETERIZATION N/A 02/09/2016   Procedure: Left Heart Cath and Coronary Angiography;  Surgeon: Tonny Bollman, MD;  Location: [facility] INVASIVE CV LAB;  Service: Cardiovascular;  Laterality: N/A;   CESAREAN SECTION     COLONOSCOPY     COLONOSCOPY WITH PROPOFOL N/A 07/04/2022    Procedure: COLONOSCOPY WITH PROPOFOL;  Surgeon: Charlott Rakes, MD;  Location: [facility] ENDOSCOPY;  Service: Gastroenterology;  Laterality: N/A;   REDUCTION MAMMAPLASTY     TUBAL LIGATION      Current Medications: Current Meds  Medication Sig   amLODipine (NORVASC) 10 MG tablet Take 10 mg by mouth daily.   apixaban (ELIQUIS) 5 MG TABS tablet Take 1 tablet (5 mg total) by mouth 2 (two) times daily.   carvedilol (COREG) 6.25 MG tablet Take 6.25 mg by mouth 2 (two) times daily.   chlorthalidone (HYGROTON) 25 MG tablet Take 25 mg by mouth daily.   Continuous Glucose Sensor (FREESTYLE LIBRE 3 SENSOR) MISC PLACE ON ARM AS DIRECTED 14 DAYS   diltiazem (CARDIZEM) 30 MG tablet Take 1 tablet PO up to 4x a day prn for HR > 100.   dorzolamide-timolol (COSOPT) 22.3-6.8 MG/ML ophthalmic solution Place 1 drop into both eyes at bedtime.   FARXIGA 10 MG TABS tablet Take 10 mg by mouth daily.   glimepiride (AMARYL) 4 MG tablet Take 8 mg by mouth daily with breakfast.   LANTUS 100 UNIT/ML injection Inject 25 Units into the skin at bedtime.   nitroGLYCERIN (NITROSTAT) 0.4 MG SL tablet Place 1 tablet (0.4 mg total) under the tongue every 5 (five) minutes as needed for chest pain (Take every 5 minutes as needed for chest pain. Make sure you are sitting when you take this. If pain is not relieved after 2 doses, call 911. Do not take more than 3 doses in 15 minutes).   OZEMPIC, 0.25 OR 0.5 MG/DOSE, 2 MG/3ML SOPN Inject 0.5 mg into the skin once a week.   rosuvastatin (CRESTOR) 20 MG tablet Take 20 mg by mouth daily.   triamcinolone (KENALOG) 0.025 % cream Apply 1 Application topically daily.     Allergies:   E-mycin [erythromycin], Avocado, Banana, Glucophage [metformin hcl], Kiwi extract, Mango flavoring agent (non-screening), Metoprolol, Xarelto [rivaroxaban], Diltiazem hcl, Levemir [insulin detemir], and Lexapro [escitalopram oxalate]   Social History   Socioeconomic History   Marital status: Married    Spouse  name: Not on file   Number of children: Not on file   Years of education: Not on file   Highest education level: Not on file  Occupational History   Not on file  Tobacco Use   Smoking status: Never   Smokeless tobacco: Never  Vaping Use   Vaping status: Never Used  Substance and Sexual Activity   Alcohol use: Yes    Alcohol/week: 2.0 standard drinks of alcohol    Types: 2 Cans of beer per week    Comment: rare   Drug use: No   Sexual activity: Not on file  Other Topics Concern   Not on file  Social History Narrative   Not on file   Social Drivers of Health   Financial Resource Strain: Not on file  Food  Insecurity: Not on file  Transportation Needs: Not on file  Physical Activity: Not on file  Stress: Not on file  Social Connections: Not on file     Family History: The patient's family history includes Alzheimer's disease in an other family member; CAD in an other family member; Diabetes in an other family member; Kidney failure in her father.  ROS:   Please see the history of present illness.     All other systems reviewed and are negative.  EKGs/Labs/Other Studies Reviewed:    The following studies were reviewed today: Cardiac catheterization 02/09/2016 1. Angiographically normal coronary arteries 2. Elevated LVEDP  Echocardiogram 02/09/2016  - Left ventricle: The cavity size was normal. Wall thickness was    increased in a pattern of moderate LVH. Systolic function was    normal. The estimated ejection fraction was in the range of 60%    to 65%. Wall motion was normal; there were no regional wall    motion abnormalities. Doppler parameters are consistent with    abnormal left ventricular relaxation (grade 1 diastolic    dysfunction). The E/e&' ratio is >15, suggesting elevated LV    filling pressure.  - Aortic valve: Trileaflet. Sclerosis without stenosis. There was    no regurgitation.  - Mitral valve: Calcified annulus. Mildly thickened leaflets .     There was trivial regurgitation.  - Left atrium: The atrium was mildly dilated.  - Tricuspid valve: There was trivial regurgitation.  - Pulmonary arteries: PA peak pressure: 23 mm Hg (S).  - Inferior vena cava: The vessel was normal in size. The    respirophasic diameter changes were in the normal range (>= 50%),    consistent with normal central venous pressure.   Echocardiogram 04/25/2018 - Left ventricle: The cavity size was normal. Wall thickness was    increased in a pattern of mild LVH. Systolic function was    vigorous. The estimated ejection fraction was in the range of 65%    to 70%.  - Mitral valve: There was mild regurgitation.     EKG:  EKG is ordered today.  The ekg ordered today demonstrates normal sinus rhythm, nonspecific T wave changes, QTC 406 ms  Recent Labs: No results found for requested labs within last 365 days.  09/12//2022 Hemoglobin A1c 9.9% Creatinine 1.76, potassium 4.0  Recent Lipid Panel    Component Value Date/Time   CHOL 172 02/09/2016 0734   TRIG 94 02/09/2016 0734   HDL 72 02/09/2016 0734   CHOLHDL 2.4 02/09/2016 0734   VLDL 19 02/09/2016 0734   LDLCALC 81 02/09/2016 0734  08/23/2020 Cholesterol 128, HDL 63, LDL 50, triglycerides 73   Risk Assessment/Calculations:    CHA2DS2-VASc Score =     This indicates a  % annual risk of stroke. The patient's score is based upon:           Physical Exam:    VS:  BP 122/62 (BP Location: Left Arm, Patient Position: Sitting, Cuff Size: Large)   Pulse 69   Ht 5\' 2"  (1.575 m)   Wt 258 lb 9.6 oz (117.3 kg)   SpO2 96%   BMI 47.30 kg/m     Wt Readings from Last 3 Encounters:  07/16/23 258 lb 9.6 oz (117.3 kg)  07/04/22 284 lb 9.8 oz (129.1 kg)  06/14/22 284 lb 9.6 oz (129.1 kg)     GEN: Super morbidly obese well nourished, well developed in no acute distress HEENT: Normal NECK: Unable to evaluate the JVD;  No carotid bruits LYMPHATICS: No lymphadenopathy CARDIAC: RRR, early peaking 1-2/6  aortic ejection murmur, no diastolic murmurs, rubs, gallops RESPIRATORY:  Clear to auscultation without rales, wheezing or rhonchi  ABDOMEN: Soft, non-tender, non-distended MUSCULOSKELETAL: Chronic brawny 3+ edema with tough scaly skin and dependent cyanosis; no venous stasis ulcers; no deformity  SKIN: Warm and dry NEUROLOGIC:  Alert and oriented x 3 PSYCHIATRIC:  Normal affect   ASSESSMENT:    1. Coronary artery disease of native artery of native heart with stable angina pectoris (HCC)   2. Paroxysmal atrial fibrillation (HCC)   3. Chronic diastolic heart failure (HCC)   4. Iron deficiency anemia due to chronic blood loss   5. OSA (obstructive sleep apnea)   6. Morbid obesity (HCC)   7. Essential hypertension   8. Type 2 diabetes mellitus with stage 3a chronic kidney disease, with long-term current use of insulin (HCC)   9. Mixed hyperlipidemia   10. Acquired thrombophilia (HCC)      PLAN:    In order of problems listed above:  Stable angina pectoris: She describes a pattern of chest discomfort that is strongly suggestive of stable angina.  The threshold for angina was lower when she was severely anemic.  Unfortunately, due to her kidney dysfunction I think we should avoid contrast based procedure and consequently any revascularization procedures at this time.  Obviously, if she should have an acute coronary syndrome (either myocardial infarction or threatened myocardial infarction) we should go ahead and perform cardiac catheterization appropriate revascularization and deal with the kidney problems later.  As long as the symptoms are stable (provoked by exertion or anemia) we will focus on medical therapy.  Instructed her in the use of sublingual nitroglycerin and when she should seek emergency medical attention (unprovoked chest pain lasting for more than 20-30 minutes not relieved by 3 consecutive sublingual nitroglycerin tablets). AFib: Currently she is essentially asymptomatic.  No  recent palpitations. CHF: Although there is some echocardiographic documentation of left ventricular diastolic dysfunction, clinically she has just right heart failure, likely due to obesity and sleep apnea.  NYHA functional class III primarily due to obesity and deconditioning.  Avoid aggressive diuresis due to the potential for worsening kidney function. Anemia: Probably multifactorial related to both iron deficiency (chronic anticoagulation) and erythropoietin deficiency from renal failure). OSA: Recommend 100% compliance with CPAP. Super-obesity: Severely limits the usefulness of her physical exam to assess volume status.  Superobesity contributes to her chronic medical problems including OSA, heart failure, diabetes, HTN and dyslipidemia. HTN: Excellent control. DM: Very well-controlled.  Prefer to use GLP-1 agonist and SGLT2 inhibitors for control and reduce the use of insulin and insulin secretagogues.  She has retinopathy and nephropathy due to poorly controlled type 2 diabetes mellitus. CKD 4: Sees Dr. Glenna Fellows.  Due to diabetic nephropathy.  Back in 2020 her creatinine was 1.0.  In January 2022 creatinine was 1.33.  Since her COVID-19 infection in the spring 2023 creatinine has been 1.61-1.81.  Most recent creatinine this year in October was 2.30.  Would like to avoid any contrast based procedures unless she is threatening to have a myocardial infarction. HLP: Despite hyperglycemia, her lipid parameters are pretty good.  Continue rosuvastatin. Anticoagulation: Denies falls or serious bleeding issues.        Medication Adjustments/Labs and Tests Ordered: Current medicines are reviewed at length with the patient today.  Concerns regarding medicines are outlined above.  Orders Placed This Encounter  Procedures   EKG 12-Lead    Meds  ordered this encounter  Medications   nitroGLYCERIN (NITROSTAT) 0.4 MG SL tablet    Sig: Place 1 tablet (0.4 mg total) under the tongue every 5 (five)  minutes as needed for chest pain (Take every 5 minutes as needed for chest pain. Make sure you are sitting when you take this. If pain is not relieved after 2 doses, call 911. Do not take more than 3 doses in 15 minutes).    Dispense:  30 tablet    Refill:  3     Patient Instructions  Medication Instructions:   NITROGLYCERIN  is a type of vasodilator. It relaxes blood vessels, increasing the blood and oxygen supply to your heart. This medicine is used to relieve chest pain caused by angina.  In an angina attack (CHEST PAIN), you should feel better within 5 minutes after your first dose. Do not swallow whole. Place tablet under your tongue. Sit down when taking this medicine. You can take a dose every 5 minutes up to a total of 3 doses. If you do not feel better or feel worse after 1 dose, call 9-1-1 at once. Do not take more than 3 doses in 15 minutes. Do not take your medicine more often than directed.  *If you need a refill on your cardiac medications before your next appointment, please call your pharmacy*  Follow-Up: At [facility], you and your health needs are our priority.  As part of our continuing mission to provide you with exceptional heart care, we have created designated Provider Care Teams.  These Care Teams include your primary Cardiologist (physician) and Advanced Practice Providers (APPs -  Physician Assistants and Nurse Practitioners) who all work together to provide you with the care you need, when you need it.  We recommend signing up for the patient portal called "MyChart".  Sign up information is provided on this After Visit Summary.  MyChart is used to connect with patients for Virtual Visits (Telemedicine).  Patients are able to view lab/test results, encounter notes, upcoming appointments, etc.  Non-urgent messages can be sent to your provider as well.   To learn more about what you can do with MyChart, go to [email or web address].    Your next appointment:    1 year(s)  Provider:   Thurmon Fair, MD             Signed, Thurmon Fair, MD  07/16/2023 6:28 PM    [facility]

## 2023-07-16 ENCOUNTER — Encounter: Payer: Self-pay | Admitting: Cardiovascular Disease

## 2023-07-16 ENCOUNTER — Ambulatory Visit: Payer: 59 | Attending: Cardiovascular Disease | Admitting: Cardiovascular Disease

## 2023-07-16 VITALS — BP 122/62 | HR 69 | Ht 62.0 in | Wt 258.6 lb

## 2023-07-16 DIAGNOSIS — I5032 Chronic diastolic (congestive) heart failure: Secondary | ICD-10-CM | POA: Diagnosis not present

## 2023-07-16 DIAGNOSIS — D500 Iron deficiency anemia secondary to blood loss (chronic): Secondary | ICD-10-CM | POA: Diagnosis not present

## 2023-07-16 DIAGNOSIS — G4733 Obstructive sleep apnea (adult) (pediatric): Secondary | ICD-10-CM

## 2023-07-16 DIAGNOSIS — Z794 Long term (current) use of insulin: Secondary | ICD-10-CM

## 2023-07-16 DIAGNOSIS — D6869 Other thrombophilia: Secondary | ICD-10-CM

## 2023-07-16 DIAGNOSIS — I25118 Atherosclerotic heart disease of native coronary artery with other forms of angina pectoris: Secondary | ICD-10-CM

## 2023-07-16 DIAGNOSIS — E782 Mixed hyperlipidemia: Secondary | ICD-10-CM

## 2023-07-16 DIAGNOSIS — I480 Paroxysmal atrial fibrillation: Secondary | ICD-10-CM

## 2023-07-16 DIAGNOSIS — N1831 Chronic kidney disease, stage 3a: Secondary | ICD-10-CM

## 2023-07-16 DIAGNOSIS — E1122 Type 2 diabetes mellitus with diabetic chronic kidney disease: Secondary | ICD-10-CM

## 2023-07-16 DIAGNOSIS — I10 Essential (primary) hypertension: Secondary | ICD-10-CM

## 2023-07-16 MED ORDER — NITROGLYCERIN 0.4 MG SL SUBL: 0.4000 mg | SUBLINGUAL_TABLET | SUBLINGUAL | 3 refills | Status: AC | PRN

## 2023-07-16 NOTE — Patient Instructions (Signed)
Medication Instructions:   NITROGLYCERIN  is a type of vasodilator. It relaxes blood vessels, increasing the blood and oxygen supply to your heart. This medicine is used to relieve chest pain caused by angina.  In an angina attack (CHEST PAIN), you should feel better within 5 minutes after your first dose. Do not swallow whole. Place tablet under your tongue. Sit down when taking this medicine. You can take a dose every 5 minutes up to a total of 3 doses. If you do not feel better or feel worse after 1 dose, call 9-1-1 at once. Do not take more than 3 doses in 15 minutes. Do not take your medicine more often than directed.  *If you need a refill on your cardiac medications before your next appointment, please call your pharmacy*  Follow-Up: At [facility], you and your health needs are our priority.  As part of our continuing mission to provide you with exceptional heart care, we have created designated Provider Care Teams.  These Care Teams include your primary Cardiologist (physician) and Advanced Practice Providers (APPs -  Physician Assistants and Nurse Practitioners) who all work together to provide you with the care you need, when you need it.  We recommend signing up for the patient portal called "MyChart".  Sign up information is provided on this After Visit Summary.  MyChart is used to connect with patients for Virtual Visits (Telemedicine).  Patients are able to view lab/test results, encounter notes, upcoming appointments, etc.  Non-urgent messages can be sent to your provider as well.   To learn more about what you can do with MyChart, go to [email or web address].    Your next appointment:   1 year(s)  Provider:   Thurmon Fair, MD

## 2023-10-01 DIAGNOSIS — I10 Essential (primary) hypertension: Secondary | ICD-10-CM | POA: Diagnosis not present

## 2023-10-01 DIAGNOSIS — N184 Chronic kidney disease, stage 4 (severe): Secondary | ICD-10-CM | POA: Diagnosis not present

## 2023-10-01 DIAGNOSIS — G4733 Obstructive sleep apnea (adult) (pediatric): Secondary | ICD-10-CM | POA: Diagnosis not present

## 2023-10-01 DIAGNOSIS — I480 Paroxysmal atrial fibrillation: Secondary | ICD-10-CM | POA: Diagnosis not present

## 2023-10-01 DIAGNOSIS — I890 Lymphedema, not elsewhere classified: Secondary | ICD-10-CM | POA: Diagnosis not present

## 2023-10-01 DIAGNOSIS — E1121 Type 2 diabetes mellitus with diabetic nephropathy: Secondary | ICD-10-CM | POA: Diagnosis not present

## 2023-10-01 DIAGNOSIS — E782 Mixed hyperlipidemia: Secondary | ICD-10-CM | POA: Diagnosis not present

## 2023-10-01 DIAGNOSIS — R296 Repeated falls: Secondary | ICD-10-CM | POA: Diagnosis not present

## 2023-10-01 DIAGNOSIS — D631 Anemia in chronic kidney disease: Secondary | ICD-10-CM | POA: Diagnosis not present

## 2023-10-01 DIAGNOSIS — R269 Unspecified abnormalities of gait and mobility: Secondary | ICD-10-CM | POA: Diagnosis not present

## 2023-11-05 DIAGNOSIS — R262 Difficulty in walking, not elsewhere classified: Secondary | ICD-10-CM | POA: Diagnosis not present

## 2023-11-05 DIAGNOSIS — M6281 Muscle weakness (generalized): Secondary | ICD-10-CM | POA: Diagnosis not present

## 2023-11-07 DIAGNOSIS — H35372 Puckering of macula, left eye: Secondary | ICD-10-CM | POA: Diagnosis not present

## 2023-11-07 DIAGNOSIS — E113513 Type 2 diabetes mellitus with proliferative diabetic retinopathy with macular edema, bilateral: Secondary | ICD-10-CM | POA: Diagnosis not present

## 2023-11-07 DIAGNOSIS — H26492 Other secondary cataract, left eye: Secondary | ICD-10-CM | POA: Diagnosis not present

## 2023-11-08 DIAGNOSIS — N281 Cyst of kidney, acquired: Secondary | ICD-10-CM | POA: Diagnosis not present

## 2023-11-08 DIAGNOSIS — N184 Chronic kidney disease, stage 4 (severe): Secondary | ICD-10-CM | POA: Diagnosis not present

## 2023-11-08 DIAGNOSIS — I480 Paroxysmal atrial fibrillation: Secondary | ICD-10-CM | POA: Diagnosis not present

## 2023-11-08 DIAGNOSIS — D631 Anemia in chronic kidney disease: Secondary | ICD-10-CM | POA: Diagnosis not present

## 2023-11-08 DIAGNOSIS — E785 Hyperlipidemia, unspecified: Secondary | ICD-10-CM | POA: Diagnosis not present

## 2023-11-08 DIAGNOSIS — E1122 Type 2 diabetes mellitus with diabetic chronic kidney disease: Secondary | ICD-10-CM | POA: Diagnosis not present

## 2023-11-08 DIAGNOSIS — I5032 Chronic diastolic (congestive) heart failure: Secondary | ICD-10-CM | POA: Diagnosis not present

## 2023-11-08 DIAGNOSIS — I129 Hypertensive chronic kidney disease with stage 1 through stage 4 chronic kidney disease, or unspecified chronic kidney disease: Secondary | ICD-10-CM | POA: Diagnosis not present

## 2023-11-12 DIAGNOSIS — M6281 Muscle weakness (generalized): Secondary | ICD-10-CM | POA: Diagnosis not present

## 2023-11-12 DIAGNOSIS — R262 Difficulty in walking, not elsewhere classified: Secondary | ICD-10-CM | POA: Diagnosis not present

## 2023-11-19 DIAGNOSIS — R262 Difficulty in walking, not elsewhere classified: Secondary | ICD-10-CM | POA: Diagnosis not present

## 2023-11-19 DIAGNOSIS — M6281 Muscle weakness (generalized): Secondary | ICD-10-CM | POA: Diagnosis not present

## 2023-11-26 DIAGNOSIS — R262 Difficulty in walking, not elsewhere classified: Secondary | ICD-10-CM | POA: Diagnosis not present

## 2023-11-26 DIAGNOSIS — M6281 Muscle weakness (generalized): Secondary | ICD-10-CM | POA: Diagnosis not present

## 2023-12-03 DIAGNOSIS — M6281 Muscle weakness (generalized): Secondary | ICD-10-CM | POA: Diagnosis not present

## 2023-12-03 DIAGNOSIS — R262 Difficulty in walking, not elsewhere classified: Secondary | ICD-10-CM | POA: Diagnosis not present

## 2023-12-10 DIAGNOSIS — R262 Difficulty in walking, not elsewhere classified: Secondary | ICD-10-CM | POA: Diagnosis not present

## 2023-12-19 DIAGNOSIS — M6281 Muscle weakness (generalized): Secondary | ICD-10-CM | POA: Diagnosis not present

## 2023-12-19 DIAGNOSIS — R262 Difficulty in walking, not elsewhere classified: Secondary | ICD-10-CM | POA: Diagnosis not present

## 2023-12-24 DIAGNOSIS — R262 Difficulty in walking, not elsewhere classified: Secondary | ICD-10-CM | POA: Diagnosis not present

## 2023-12-24 DIAGNOSIS — M6281 Muscle weakness (generalized): Secondary | ICD-10-CM | POA: Diagnosis not present

## 2023-12-31 DIAGNOSIS — M6281 Muscle weakness (generalized): Secondary | ICD-10-CM | POA: Diagnosis not present

## 2023-12-31 DIAGNOSIS — R262 Difficulty in walking, not elsewhere classified: Secondary | ICD-10-CM | POA: Diagnosis not present

## 2024-01-07 DIAGNOSIS — M6281 Muscle weakness (generalized): Secondary | ICD-10-CM | POA: Diagnosis not present

## 2024-01-07 DIAGNOSIS — R262 Difficulty in walking, not elsewhere classified: Secondary | ICD-10-CM | POA: Diagnosis not present

## 2024-01-09 DIAGNOSIS — H26492 Other secondary cataract, left eye: Secondary | ICD-10-CM | POA: Diagnosis not present

## 2024-01-09 DIAGNOSIS — Z961 Presence of intraocular lens: Secondary | ICD-10-CM | POA: Diagnosis not present

## 2024-01-09 DIAGNOSIS — E113513 Type 2 diabetes mellitus with proliferative diabetic retinopathy with macular edema, bilateral: Secondary | ICD-10-CM | POA: Diagnosis not present

## 2024-01-09 DIAGNOSIS — H35372 Puckering of macula, left eye: Secondary | ICD-10-CM | POA: Diagnosis not present

## 2024-01-10 DIAGNOSIS — E1121 Type 2 diabetes mellitus with diabetic nephropathy: Secondary | ICD-10-CM | POA: Diagnosis not present

## 2024-01-10 DIAGNOSIS — E11319 Type 2 diabetes mellitus with unspecified diabetic retinopathy without macular edema: Secondary | ICD-10-CM | POA: Diagnosis not present

## 2024-01-10 DIAGNOSIS — N184 Chronic kidney disease, stage 4 (severe): Secondary | ICD-10-CM | POA: Diagnosis not present

## 2024-01-10 DIAGNOSIS — E113513 Type 2 diabetes mellitus with proliferative diabetic retinopathy with macular edema, bilateral: Secondary | ICD-10-CM | POA: Diagnosis not present

## 2024-01-14 DIAGNOSIS — R262 Difficulty in walking, not elsewhere classified: Secondary | ICD-10-CM | POA: Diagnosis not present

## 2024-01-14 DIAGNOSIS — M6281 Muscle weakness (generalized): Secondary | ICD-10-CM | POA: Diagnosis not present

## 2024-01-21 DIAGNOSIS — E11311 Type 2 diabetes mellitus with unspecified diabetic retinopathy with macular edema: Secondary | ICD-10-CM | POA: Diagnosis not present

## 2024-01-21 DIAGNOSIS — E11319 Type 2 diabetes mellitus with unspecified diabetic retinopathy without macular edema: Secondary | ICD-10-CM | POA: Diagnosis not present

## 2024-01-21 DIAGNOSIS — I5032 Chronic diastolic (congestive) heart failure: Secondary | ICD-10-CM | POA: Diagnosis not present

## 2024-01-21 DIAGNOSIS — M6281 Muscle weakness (generalized): Secondary | ICD-10-CM | POA: Diagnosis not present

## 2024-01-21 DIAGNOSIS — R262 Difficulty in walking, not elsewhere classified: Secondary | ICD-10-CM | POA: Diagnosis not present

## 2024-01-21 DIAGNOSIS — E1121 Type 2 diabetes mellitus with diabetic nephropathy: Secondary | ICD-10-CM | POA: Diagnosis not present

## 2024-01-21 DIAGNOSIS — E113513 Type 2 diabetes mellitus with proliferative diabetic retinopathy with macular edema, bilateral: Secondary | ICD-10-CM | POA: Diagnosis not present

## 2024-01-21 DIAGNOSIS — N184 Chronic kidney disease, stage 4 (severe): Secondary | ICD-10-CM | POA: Diagnosis not present

## 2024-01-28 DIAGNOSIS — M6281 Muscle weakness (generalized): Secondary | ICD-10-CM | POA: Diagnosis not present

## 2024-01-28 DIAGNOSIS — R262 Difficulty in walking, not elsewhere classified: Secondary | ICD-10-CM | POA: Diagnosis not present

## 2024-01-29 DIAGNOSIS — N184 Chronic kidney disease, stage 4 (severe): Secondary | ICD-10-CM | POA: Diagnosis not present

## 2024-01-29 DIAGNOSIS — E1121 Type 2 diabetes mellitus with diabetic nephropathy: Secondary | ICD-10-CM | POA: Diagnosis not present

## 2024-01-29 DIAGNOSIS — I480 Paroxysmal atrial fibrillation: Secondary | ICD-10-CM | POA: Diagnosis not present

## 2024-01-29 DIAGNOSIS — E782 Mixed hyperlipidemia: Secondary | ICD-10-CM | POA: Diagnosis not present

## 2024-01-29 DIAGNOSIS — L299 Pruritus, unspecified: Secondary | ICD-10-CM | POA: Diagnosis not present

## 2024-01-29 DIAGNOSIS — I872 Venous insufficiency (chronic) (peripheral): Secondary | ICD-10-CM | POA: Diagnosis not present

## 2024-01-29 DIAGNOSIS — I10 Essential (primary) hypertension: Secondary | ICD-10-CM | POA: Diagnosis not present

## 2024-01-29 DIAGNOSIS — N189 Chronic kidney disease, unspecified: Secondary | ICD-10-CM | POA: Diagnosis not present

## 2024-02-09 DIAGNOSIS — E11319 Type 2 diabetes mellitus with unspecified diabetic retinopathy without macular edema: Secondary | ICD-10-CM | POA: Diagnosis not present

## 2024-02-09 DIAGNOSIS — N184 Chronic kidney disease, stage 4 (severe): Secondary | ICD-10-CM | POA: Diagnosis not present

## 2024-02-09 DIAGNOSIS — E113513 Type 2 diabetes mellitus with proliferative diabetic retinopathy with macular edema, bilateral: Secondary | ICD-10-CM | POA: Diagnosis not present

## 2024-02-09 DIAGNOSIS — E1121 Type 2 diabetes mellitus with diabetic nephropathy: Secondary | ICD-10-CM | POA: Diagnosis not present

## 2024-02-19 DIAGNOSIS — D631 Anemia in chronic kidney disease: Secondary | ICD-10-CM | POA: Diagnosis not present

## 2024-02-19 DIAGNOSIS — E785 Hyperlipidemia, unspecified: Secondary | ICD-10-CM | POA: Diagnosis not present

## 2024-02-19 DIAGNOSIS — E1122 Type 2 diabetes mellitus with diabetic chronic kidney disease: Secondary | ICD-10-CM | POA: Diagnosis not present

## 2024-02-19 DIAGNOSIS — I129 Hypertensive chronic kidney disease with stage 1 through stage 4 chronic kidney disease, or unspecified chronic kidney disease: Secondary | ICD-10-CM | POA: Diagnosis not present

## 2024-02-19 DIAGNOSIS — N281 Cyst of kidney, acquired: Secondary | ICD-10-CM | POA: Diagnosis not present

## 2024-02-19 DIAGNOSIS — I480 Paroxysmal atrial fibrillation: Secondary | ICD-10-CM | POA: Diagnosis not present

## 2024-02-19 DIAGNOSIS — N184 Chronic kidney disease, stage 4 (severe): Secondary | ICD-10-CM | POA: Diagnosis not present

## 2024-02-19 DIAGNOSIS — I5032 Chronic diastolic (congestive) heart failure: Secondary | ICD-10-CM | POA: Diagnosis not present

## 2024-02-21 DIAGNOSIS — E11311 Type 2 diabetes mellitus with unspecified diabetic retinopathy with macular edema: Secondary | ICD-10-CM | POA: Diagnosis not present

## 2024-02-21 DIAGNOSIS — E11319 Type 2 diabetes mellitus with unspecified diabetic retinopathy without macular edema: Secondary | ICD-10-CM | POA: Diagnosis not present

## 2024-02-21 DIAGNOSIS — E113513 Type 2 diabetes mellitus with proliferative diabetic retinopathy with macular edema, bilateral: Secondary | ICD-10-CM | POA: Diagnosis not present

## 2024-02-21 DIAGNOSIS — E1121 Type 2 diabetes mellitus with diabetic nephropathy: Secondary | ICD-10-CM | POA: Diagnosis not present

## 2024-02-21 DIAGNOSIS — I5032 Chronic diastolic (congestive) heart failure: Secondary | ICD-10-CM | POA: Diagnosis not present

## 2024-02-21 DIAGNOSIS — N184 Chronic kidney disease, stage 4 (severe): Secondary | ICD-10-CM | POA: Diagnosis not present

## 2024-02-26 DIAGNOSIS — H40053 Ocular hypertension, bilateral: Secondary | ICD-10-CM | POA: Diagnosis not present

## 2024-02-26 DIAGNOSIS — N184 Chronic kidney disease, stage 4 (severe): Secondary | ICD-10-CM | POA: Diagnosis not present

## 2024-03-10 DIAGNOSIS — E1121 Type 2 diabetes mellitus with diabetic nephropathy: Secondary | ICD-10-CM | POA: Diagnosis not present

## 2024-03-10 DIAGNOSIS — E113513 Type 2 diabetes mellitus with proliferative diabetic retinopathy with macular edema, bilateral: Secondary | ICD-10-CM | POA: Diagnosis not present

## 2024-03-10 DIAGNOSIS — E11319 Type 2 diabetes mellitus with unspecified diabetic retinopathy without macular edema: Secondary | ICD-10-CM | POA: Diagnosis not present

## 2024-03-10 DIAGNOSIS — N184 Chronic kidney disease, stage 4 (severe): Secondary | ICD-10-CM | POA: Diagnosis not present

## 2024-03-12 DIAGNOSIS — H3582 Retinal ischemia: Secondary | ICD-10-CM | POA: Diagnosis not present

## 2024-03-12 DIAGNOSIS — Z961 Presence of intraocular lens: Secondary | ICD-10-CM | POA: Diagnosis not present

## 2024-03-12 DIAGNOSIS — E113513 Type 2 diabetes mellitus with proliferative diabetic retinopathy with macular edema, bilateral: Secondary | ICD-10-CM | POA: Diagnosis not present

## 2024-03-12 DIAGNOSIS — H35372 Puckering of macula, left eye: Secondary | ICD-10-CM | POA: Diagnosis not present

## 2024-03-12 DIAGNOSIS — H26492 Other secondary cataract, left eye: Secondary | ICD-10-CM | POA: Diagnosis not present

## 2024-03-23 DIAGNOSIS — N184 Chronic kidney disease, stage 4 (severe): Secondary | ICD-10-CM | POA: Diagnosis not present

## 2024-03-23 DIAGNOSIS — E113513 Type 2 diabetes mellitus with proliferative diabetic retinopathy with macular edema, bilateral: Secondary | ICD-10-CM | POA: Diagnosis not present

## 2024-03-23 DIAGNOSIS — E11311 Type 2 diabetes mellitus with unspecified diabetic retinopathy with macular edema: Secondary | ICD-10-CM | POA: Diagnosis not present

## 2024-03-23 DIAGNOSIS — E11319 Type 2 diabetes mellitus with unspecified diabetic retinopathy without macular edema: Secondary | ICD-10-CM | POA: Diagnosis not present

## 2024-03-23 DIAGNOSIS — E1121 Type 2 diabetes mellitus with diabetic nephropathy: Secondary | ICD-10-CM | POA: Diagnosis not present

## 2024-03-23 DIAGNOSIS — I5032 Chronic diastolic (congestive) heart failure: Secondary | ICD-10-CM | POA: Diagnosis not present

## 2024-04-22 DIAGNOSIS — I5032 Chronic diastolic (congestive) heart failure: Secondary | ICD-10-CM | POA: Diagnosis not present

## 2024-04-22 DIAGNOSIS — E11319 Type 2 diabetes mellitus with unspecified diabetic retinopathy without macular edema: Secondary | ICD-10-CM | POA: Diagnosis not present

## 2024-04-22 DIAGNOSIS — E113513 Type 2 diabetes mellitus with proliferative diabetic retinopathy with macular edema, bilateral: Secondary | ICD-10-CM | POA: Diagnosis not present

## 2024-04-22 DIAGNOSIS — E11311 Type 2 diabetes mellitus with unspecified diabetic retinopathy with macular edema: Secondary | ICD-10-CM | POA: Diagnosis not present

## 2024-04-22 DIAGNOSIS — N184 Chronic kidney disease, stage 4 (severe): Secondary | ICD-10-CM | POA: Diagnosis not present

## 2024-04-22 DIAGNOSIS — E1121 Type 2 diabetes mellitus with diabetic nephropathy: Secondary | ICD-10-CM | POA: Diagnosis not present

## 2024-05-14 DIAGNOSIS — H3582 Retinal ischemia: Secondary | ICD-10-CM | POA: Diagnosis not present

## 2024-05-14 DIAGNOSIS — N184 Chronic kidney disease, stage 4 (severe): Secondary | ICD-10-CM | POA: Diagnosis not present

## 2024-05-14 DIAGNOSIS — E113513 Type 2 diabetes mellitus with proliferative diabetic retinopathy with macular edema, bilateral: Secondary | ICD-10-CM | POA: Diagnosis not present

## 2024-05-14 DIAGNOSIS — H59813 Chorioretinal scars after surgery for detachment, bilateral: Secondary | ICD-10-CM | POA: Diagnosis not present

## 2024-05-14 DIAGNOSIS — H35372 Puckering of macula, left eye: Secondary | ICD-10-CM | POA: Diagnosis not present

## 2024-05-20 ENCOUNTER — Telehealth (HOSPITAL_COMMUNITY): Payer: Self-pay | Admitting: Pharmacy Technician

## 2024-05-20 ENCOUNTER — Other Ambulatory Visit (HOSPITAL_COMMUNITY): Payer: Self-pay | Admitting: Internal Medicine

## 2024-05-20 DIAGNOSIS — N281 Cyst of kidney, acquired: Secondary | ICD-10-CM | POA: Diagnosis not present

## 2024-05-20 DIAGNOSIS — N184 Chronic kidney disease, stage 4 (severe): Secondary | ICD-10-CM | POA: Diagnosis not present

## 2024-05-20 DIAGNOSIS — E1122 Type 2 diabetes mellitus with diabetic chronic kidney disease: Secondary | ICD-10-CM | POA: Diagnosis not present

## 2024-05-20 DIAGNOSIS — I480 Paroxysmal atrial fibrillation: Secondary | ICD-10-CM | POA: Diagnosis not present

## 2024-05-20 DIAGNOSIS — I129 Hypertensive chronic kidney disease with stage 1 through stage 4 chronic kidney disease, or unspecified chronic kidney disease: Secondary | ICD-10-CM | POA: Diagnosis not present

## 2024-05-20 DIAGNOSIS — D631 Anemia in chronic kidney disease: Secondary | ICD-10-CM | POA: Diagnosis not present

## 2024-05-20 DIAGNOSIS — E785 Hyperlipidemia, unspecified: Secondary | ICD-10-CM | POA: Diagnosis not present

## 2024-05-20 DIAGNOSIS — I5032 Chronic diastolic (congestive) heart failure: Secondary | ICD-10-CM | POA: Diagnosis not present

## 2024-05-20 NOTE — Telephone Encounter (Signed)
 Auth Submission: NO AUTH NEEDED Site of care: [facility] Payer: [employer] MEDICARE Medication & CPT/J Code(s) submitted: Feraheme (ferumoxytol ) [id number] Diagnosis Code: N18.9, D63.1 Route of submission (phone, fax, portal):  Phone # Fax # Auth type: Buy/Bill HB Units/visits requested: 510mg  x 2 doses Reference number: [id number] Approval from: 05/20/2024 to 07/23/24     Dagoberto Armour, CPhT [facility] Phone: [phone] 05/20/2024

## 2024-05-29 ENCOUNTER — Encounter (HOSPITAL_COMMUNITY): Payer: Self-pay | Admitting: Internal Medicine

## 2024-06-05 ENCOUNTER — Ambulatory Visit (HOSPITAL_COMMUNITY)
Admission: RE | Admit: 2024-06-05 | Discharge: 2024-06-05 | Disposition: A | Discharge status: Home / Self Care | Source: Ambulatory Visit | Attending: Internal Medicine | Admitting: Internal Medicine

## 2024-06-05 VITALS — BP 122/69 | HR 107 | Temp 98.2°F | Resp 16

## 2024-06-05 DIAGNOSIS — D631 Anemia in chronic kidney disease: Secondary | ICD-10-CM | POA: Diagnosis present

## 2024-06-05 DIAGNOSIS — N189 Chronic kidney disease, unspecified: Secondary | ICD-10-CM | POA: Insufficient documentation

## 2024-06-05 MED ORDER — SODIUM CHLORIDE 0.9 % IV SOLN
510.0000 mg | Freq: Once | INTRAVENOUS | Status: AC
  Administered 2024-06-05: 510 mg via INTRAVENOUS
  Filled 2024-06-05: qty 510

## 2024-06-12 ENCOUNTER — Ambulatory Visit (HOSPITAL_COMMUNITY)
Admission: RE | Admit: 2024-06-12 | Discharge: 2024-06-12 | Disposition: A | Discharge status: Home / Self Care | Source: Ambulatory Visit | Attending: Internal Medicine | Admitting: Internal Medicine

## 2024-06-12 VITALS — BP 112/55 | HR 66 | Temp 98.0°F | Resp 16

## 2024-06-12 DIAGNOSIS — N189 Chronic kidney disease, unspecified: Secondary | ICD-10-CM | POA: Diagnosis not present

## 2024-06-12 DIAGNOSIS — D631 Anemia in chronic kidney disease: Secondary | ICD-10-CM

## 2024-06-12 MED ORDER — SODIUM CHLORIDE 0.9 % IV SOLN
510.0000 mg | Freq: Once | INTRAVENOUS | Status: AC
  Administered 2024-06-12: 510 mg via INTRAVENOUS
  Filled 2024-06-12: qty 510

## 2024-07-23 ENCOUNTER — Ambulatory Visit: Admitting: Podiatry

## 2024-07-23 DIAGNOSIS — Z0189 Encounter for other specified special examinations: Secondary | ICD-10-CM

## 2024-07-23 DIAGNOSIS — M79675 Pain in left toe(s): Secondary | ICD-10-CM | POA: Diagnosis not present

## 2024-07-23 DIAGNOSIS — E119 Type 2 diabetes mellitus without complications: Secondary | ICD-10-CM | POA: Diagnosis not present

## 2024-07-23 DIAGNOSIS — M79674 Pain in right toe(s): Secondary | ICD-10-CM | POA: Diagnosis not present

## 2024-07-23 DIAGNOSIS — E1142 Type 2 diabetes mellitus with diabetic polyneuropathy: Secondary | ICD-10-CM | POA: Diagnosis not present

## 2024-07-23 DIAGNOSIS — B351 Tinea unguium: Secondary | ICD-10-CM | POA: Diagnosis not present

## 2024-07-23 NOTE — Progress Notes (Signed)
"  °  Subjective:  Patient ID: SMT LOKEY, female    DOB: 1960-09-17,   MRN: [id number]  No chief complaint on file.   63 y.o. female presents for concern of thickened elongated and painful nails that are difficult to trim. Requesting to have them trimmed today. Relates burning and tingling in their feet. Patient is diabetic and last A1c was  Lab Results  Component Value Date   HGBA1C 6.7 (H) 04/07/2022   .   PCP:  Aisha Harvey, MD    . Denies any other pedal complaints. Denies n/v/f/c.   Past Medical History:  Diagnosis Date   Atrial fibrillation with RVR (HCC)    Complication of anesthesia    had epidural for C-section and epidural did not take until after surgery   Depression    Diabetes mellitus without complication (HCC)    GERD (gastroesophageal reflux disease)    Heart murmur    Hyperlipidemia    Hypertension    Obesity    Sleep apnea    Temporal arteritis (HCC)     Objective:  Physical Exam: Vascular: DP/PT pulses 2/4 bilateral. CFT <3 seconds. Feet cold to touch. Absent hair growth on digits. Edema noted to bilateral lower extremities. Xerosis noted bilaterally.  Skin. No lacerations or abrasions bilateral feet. Nails 1-5 bilateral  are thickened discolored and elongated with subungual debris.  Musculoskeletal: MMT 5/5 bilateral lower extremities in DF, PF, Inversion and Eversion. Deceased ROM in DF of ankle joint.  Neurological: Sensation intact to light touch. Protective sensation diminished bilateral.    Assessment:   1. Pain due to onychomycosis of toenails of both feet   2. Type 2 diabetes mellitus with peripheral neuropathy (HCC)   3. Encounter for diabetic foot exam (HCC)      Plan:  Patient was evaluated and treated and all questions answered. -Discussed and educated patient on diabetic foot care, especially with  regards to the vascular, neurological and musculoskeletal systems.  -Stressed the importance of good glycemic control and the detriment  of not  controlling glucose levels in relation to the foot. -Discussed supportive shoes at all times and checking feet regularly.  -Mechanically debrided all nails 1-5 bilateral using sterile nail nipper and filed with dremel without incident  -Answered all patient questions -Patient to return  in 3 months for at risk foot care -Patient advised to call the office if any problems or questions arise in the meantime.   Asberry Failing, DPM    "

## 2024-10-21 ENCOUNTER — Ambulatory Visit: Admitting: Podiatry
